# Patient Record
Sex: Female | Born: 1955 | Race: White | Hispanic: No | Marital: Married | Smoking: Never smoker
Health system: Southern US, Community
[De-identification: ages and names within clinical notes are randomized; demographics above are authoritative.]

## PROBLEM LIST (undated history)

## (undated) DIAGNOSIS — C50919 Malignant neoplasm of unspecified site of unspecified female breast: Secondary | ICD-10-CM

## (undated) DIAGNOSIS — M1990 Unspecified osteoarthritis, unspecified site: Secondary | ICD-10-CM

## (undated) DIAGNOSIS — J45909 Unspecified asthma, uncomplicated: Secondary | ICD-10-CM

## (undated) DIAGNOSIS — H269 Unspecified cataract: Secondary | ICD-10-CM

## (undated) DIAGNOSIS — I10 Essential (primary) hypertension: Secondary | ICD-10-CM

## (undated) DIAGNOSIS — Z8619 Personal history of other infectious and parasitic diseases: Secondary | ICD-10-CM

## (undated) DIAGNOSIS — Z8709 Personal history of other diseases of the respiratory system: Secondary | ICD-10-CM

## (undated) DIAGNOSIS — T7840XA Allergy, unspecified, initial encounter: Secondary | ICD-10-CM

## (undated) DIAGNOSIS — E0590 Thyrotoxicosis, unspecified without thyrotoxic crisis or storm: Secondary | ICD-10-CM

## (undated) HISTORY — PX: BREAST SURGERY: SHX581

## (undated) HISTORY — DX: Personal history of other diseases of the respiratory system: Z87.09

## (undated) HISTORY — DX: Malignant neoplasm of unspecified site of unspecified female breast: C50.919

## (undated) HISTORY — DX: Personal history of other infectious and parasitic diseases: Z86.19

## (undated) HISTORY — DX: Unspecified osteoarthritis, unspecified site: M19.90

## (undated) HISTORY — DX: Essential (primary) hypertension: I10

## (undated) HISTORY — DX: Allergy, unspecified, initial encounter: T78.40XA

## (undated) HISTORY — PX: COSMETIC SURGERY: SHX468

## (undated) HISTORY — PX: TUBAL LIGATION: SHX77

## (undated) HISTORY — DX: Unspecified asthma, uncomplicated: J45.909

## (undated) HISTORY — DX: Thyrotoxicosis, unspecified without thyrotoxic crisis or storm: E05.90

## (undated) HISTORY — DX: Unspecified cataract: H26.9

---

## 2002-10-01 HISTORY — PX: MASTECTOMY: SHX3

## 2014-01-18 DIAGNOSIS — J309 Allergic rhinitis, unspecified: Secondary | ICD-10-CM | POA: Insufficient documentation

## 2017-07-01 LAB — HM PAP SMEAR: HM Pap smear: UNDETERMINED

## 2018-01-01 LAB — HM HEPATITIS C SCREENING LAB: HM Hepatitis Screen: NEGATIVE

## 2018-07-31 LAB — HM MAMMOGRAPHY

## 2019-03-02 HISTORY — PX: CATARACT EXTRACTION, BILATERAL: SHX1313

## 2019-03-23 LAB — BASIC METABOLIC PANEL
BUN: 13 (ref 4–21)
Creatinine: 0.6 (ref 0.5–1.1)
Glucose: 97
Potassium: 4 (ref 3.4–5.3)
Sodium: 144 (ref 137–147)

## 2019-03-23 LAB — LIPID PANEL
Cholesterol: 224 — AB (ref 0–200)
HDL: 52 (ref 35–70)
LDL Cholesterol: 149
LDl/HDL Ratio: 4.3
Triglycerides: 115 (ref 40–160)

## 2019-06-01 LAB — HM COLONOSCOPY

## 2019-09-07 ENCOUNTER — Ambulatory Visit (INDEPENDENT_AMBULATORY_CARE_PROVIDER_SITE_OTHER): Payer: BC Managed Care – PPO | Admitting: Family Medicine

## 2019-09-07 ENCOUNTER — Encounter: Payer: Self-pay | Admitting: Family Medicine

## 2019-09-07 ENCOUNTER — Other Ambulatory Visit: Payer: Self-pay

## 2019-09-07 VITALS — BP 134/82 | HR 93 | Temp 98.6°F | Resp 12 | Ht 59.75 in | Wt 136.8 lb

## 2019-09-07 DIAGNOSIS — R87610 Atypical squamous cells of undetermined significance on cytologic smear of cervix (ASC-US): Secondary | ICD-10-CM | POA: Diagnosis not present

## 2019-09-07 DIAGNOSIS — Z9012 Acquired absence of left breast and nipple: Secondary | ICD-10-CM | POA: Diagnosis not present

## 2019-09-07 DIAGNOSIS — K5790 Diverticulosis of intestine, part unspecified, without perforation or abscess without bleeding: Secondary | ICD-10-CM | POA: Insufficient documentation

## 2019-09-07 DIAGNOSIS — Z853 Personal history of malignant neoplasm of breast: Secondary | ICD-10-CM | POA: Insufficient documentation

## 2019-09-07 NOTE — Patient Instructions (Signed)
Since your last PAP smear was negative for HPV testing (the virus that causes cervical cancer) the recommendation is to follow-up in 3 years.   Plan to schedule an annual exam in the next year and we will repeat a PAP at that visit.

## 2019-09-07 NOTE — Progress Notes (Signed)
   Subjective:     Kenedi Lattin is a 63 y.o. female presenting for Establish Care (previous PCP Dr Lilia Pro with [facility].)     HPI   #Abnormal PAP smear - has not had a PAP in 2 years - no new symptoms   #Hx of breast cancer  - needs repeat mammogram   Review of Systems  Constitutional: Negative for chills and fever.     Social History   Tobacco Use  Smoking Status Never Smoker  Smokeless Tobacco Never Used        Objective:    BP Readings from Last 3 Encounters:  09/07/19 134/82   Wt Readings from Last 3 Encounters:  09/07/19 136 lb 12 oz (62 kg)    BP 134/82   Pulse 93   Temp 98.6 F (37 C)   Resp 12   Ht 4' 11.75" (1.518 m)   Wt 136 lb 12 oz (62 kg)   SpO2 97%   BMI 26.93 kg/m    Physical Exam Constitutional:      General: She is not in acute distress.    Appearance: She is well-developed. She is not diaphoretic.  HENT:     Right Ear: External ear normal.     Left Ear: External ear normal.     Nose: Nose normal.  Eyes:     Conjunctiva/sclera: Conjunctivae normal.  Neck:     Musculoskeletal: Neck supple.  Cardiovascular:     Rate and Rhythm: Normal rate.  Pulmonary:     Effort: Pulmonary effort is normal.  Skin:    General: Skin is warm and dry.     Capillary Refill: Capillary refill takes less than 2 seconds.  Neurological:     Mental Status: She is alert. Mental status is at baseline.  Psychiatric:        Mood and Affect: Mood normal.        Behavior: Behavior normal.           Assessment & Plan:   Problem List Items Addressed This Visit      Genitourinary   ASCUS of cervix with negative high risk HPV    Noted in 2018. Recommended f/u is 3 years with HPV testing. Will repeat next year        Other   Hx of breast cancer - Primary    Due for annual mammogram. Ordered      Relevant Orders   MM DIGITAL SCREENING W/ IMPLANTS BILATERAL   Status post left mastectomy   Relevant Orders   MM DIGITAL SCREENING W/  IMPLANTS BILATERAL       Return for annual in 8-10 months.  Lesleigh Noe, MD

## 2019-09-07 NOTE — Assessment & Plan Note (Signed)
Due for annual mammogram. Ordered

## 2019-09-07 NOTE — Assessment & Plan Note (Signed)
Noted in 2018. Recommended f/u is 3 years with HPV testing. Will repeat next year

## 2019-11-11 ENCOUNTER — Ambulatory Visit
Admission: RE | Admit: 2019-11-11 | Discharge: 2019-11-11 | Disposition: A | Discharge status: Home / Self Care | Payer: 59 | Source: Ambulatory Visit | Attending: Family Medicine | Admitting: Family Medicine

## 2019-11-11 DIAGNOSIS — Z9012 Acquired absence of left breast and nipple: Secondary | ICD-10-CM | POA: Diagnosis not present

## 2019-11-11 DIAGNOSIS — Z1231 Encounter for screening mammogram for malignant neoplasm of breast: Secondary | ICD-10-CM | POA: Insufficient documentation

## 2019-11-11 DIAGNOSIS — Z853 Personal history of malignant neoplasm of breast: Secondary | ICD-10-CM | POA: Diagnosis not present

## 2019-11-12 ENCOUNTER — Encounter: Payer: Self-pay | Admitting: Family Medicine

## 2020-05-31 ENCOUNTER — Other Ambulatory Visit (HOSPITAL_COMMUNITY)
Admission: RE | Admit: 2020-05-31 | Discharge: 2020-05-31 | Disposition: A | Discharge status: Home / Self Care | Payer: 59 | Source: Ambulatory Visit | Attending: Family Medicine | Admitting: Family Medicine

## 2020-05-31 ENCOUNTER — Encounter: Payer: Self-pay | Admitting: Family Medicine

## 2020-05-31 ENCOUNTER — Other Ambulatory Visit: Payer: Self-pay

## 2020-05-31 ENCOUNTER — Ambulatory Visit (INDEPENDENT_AMBULATORY_CARE_PROVIDER_SITE_OTHER): Payer: 59 | Admitting: Family Medicine

## 2020-05-31 VITALS — BP 138/78 | HR 73 | Temp 98.4°F | Ht 60.0 in | Wt 137.5 lb

## 2020-05-31 DIAGNOSIS — Z131 Encounter for screening for diabetes mellitus: Secondary | ICD-10-CM

## 2020-05-31 DIAGNOSIS — Z8639 Personal history of other endocrine, nutritional and metabolic disease: Secondary | ICD-10-CM

## 2020-05-31 DIAGNOSIS — Z124 Encounter for screening for malignant neoplasm of cervix: Secondary | ICD-10-CM

## 2020-05-31 DIAGNOSIS — E782 Mixed hyperlipidemia: Secondary | ICD-10-CM | POA: Diagnosis not present

## 2020-05-31 DIAGNOSIS — Z0000 Encounter for general adult medical examination without abnormal findings: Secondary | ICD-10-CM | POA: Diagnosis not present

## 2020-05-31 LAB — LIPID PANEL
Cholesterol: 231 mg/dL — ABNORMAL HIGH (ref 0–200)
HDL: 55.4 mg/dL (ref >39.00)
LDL Cholesterol: 153 mg/dL — ABNORMAL HIGH (ref 0–99)
NonHDL: 175.57
Total CHOL/HDL Ratio: 4
Triglycerides: 113 mg/dL (ref 0.0–149.0)
VLDL: 22.6 mg/dL (ref 0.0–40.0)

## 2020-05-31 LAB — COMPREHENSIVE METABOLIC PANEL
ALT: 14 U/L (ref 0–35)
AST: 15 U/L (ref 0–37)
Albumin: 4.7 g/dL (ref 3.5–5.2)
Alkaline Phosphatase: 72 U/L (ref 39–117)
BUN: 14 mg/dL (ref 6–23)
CO2: 25 mEq/L (ref 19–32)
Calcium: 9.7 mg/dL (ref 8.4–10.5)
Chloride: 105 mEq/L (ref 96–112)
Creatinine, Ser: 0.63 mg/dL (ref 0.40–1.20)
GFR: 95.13 mL/min (ref >60.00)
Glucose, Bld: 85 mg/dL (ref 70–99)
Potassium: 3.9 mEq/L (ref 3.5–5.1)
Sodium: 140 mEq/L (ref 135–145)
Total Bilirubin: 0.4 mg/dL (ref 0.2–1.2)
Total Protein: 6.9 g/dL (ref 6.0–8.3)

## 2020-05-31 LAB — HEMOGLOBIN A1C: Hgb A1c MFr Bld: 5.8 % (ref 4.6–6.5)

## 2020-05-31 LAB — TSH: TSH: 1.66 u[IU]/mL (ref 0.35–4.50)

## 2020-05-31 NOTE — Progress Notes (Signed)
Annual Exam   Chief Complaint:  Chief Complaint  Patient presents with  . Annual Exam    History of Present Illness:  [name] is a 64 y.o. No obstetric history on file. who LMP was No LMP recorded. Patient is postmenopausal., presents today for her annual examination.     Nutrition She does not get adequate calcium and Vitamin D in her diet. Diet: tries to do low carb Exercise: walking around, not specific routine   Safety The patient wears seatbelts: yes.     The patient feels safe at home and in their relationships: yes.  Dentist: yes Eye doctor: yes  Menstrual:  Symptoms of menopause:no  GYN She is single partner, contraception - post menopausal status.    Cervical Cancer Screening (21-65):   Last Pap:   October 2018 Results were: no abnormalities / HPV not done History of abnormal PAPs in the past  Breast Cancer Screening (Age 59-74):  There is no FH of breast cancer. There is FH of ovarian cancer. BRCA screening Not Indicated.  Last Mammogram: 11/2019 The patient does want a mammogram this year.    Colon Cancer Screening:  Age 45-75 yo - benefits outweigh the risk. Adults 60-85 yo who have never been screened benefit.  Benefits: 134000 people in 2016 will be diagnosed and 49,000 will die - early detection helps [place]: Complications 2/2 to colonoscopy High Risk (Colonoscopy): genetic disorder (Lynch syndrome or familial adenomatous polyposis), personal hx of IBD, previous adenomatous polyp, or previous colorectal cancer, FamHx start 10 years before the age at diagnosis, increased in males and black race  Options:  FIT - looks for hemoglobin (blood in the stool) - specific and fairly sensitive - must be done annually Cologuard - looks for DNA and blood - more sensitive - therefore can have more false positives, every 3 years Colonoscopy - every 10 years if normal - sedation, bowl prep, must have someone drive you  Shared decision making and the patient  had decided to do - up to date on colonoscopy.   Social History   Tobacco Use  Smoking Status Never Smoker  Smokeless Tobacco Never Used   Weight Wt Readings from Last 3 Encounters:  05/31/20 137 lb 8 oz (62.4 kg)  09/07/19 136 lb 12 oz (62 kg)   Patient has high BMI  BMI Readings from Last 1 Encounters:  05/31/20 26.85 kg/m     Chronic disease screening Blood pressure monitoring:  BP Readings from Last 3 Encounters:  05/31/20 138/78  09/07/19 134/82    Lipid Monitoring: Indication for screening: age >38, obesity, diabetes, family hx, CV risk factors.  Lipid screening: Yes  Lab Results  Component Value Date   CHOL 224 (A) 03/23/2019   HDL 52 03/23/2019   LDLCALC 149 03/23/2019   TRIG 115 03/23/2019     Diabetes Screening: age >57, overweight, family hx, PCOS, hx of gestational diabetes, at risk ethnicity Diabetes Screening screening: Yes  No results found for: HGBA1C   Past Medical History:  Diagnosis Date  . Allergy   . Arthritis   . Asthma   . Breast cancer ([place])   . History of chicken pox   . History of chronic bronchitis   . Hyperthyroidism     Past Surgical History:  Procedure Laterality Date  . CATARACT EXTRACTION, BILATERAL  03/2019  . CESAREAN SECTION    . MASTECTOMY Left 2004    Prior to Admission medications   Medication Sig Start Date End Date  Taking? Authorizing Provider  diphenhydrAMINE (BENADRYL) 25 mg capsule Take 25 mg by mouth at bedtime.   Yes [provider]  Glucosamine HCl (GLUCOSAMINE PO) Take 1,500 mg by mouth in the morning and at bedtime.   Yes [provider]  Melatonin 5 MG CAPS Take 1 capsule by mouth at bedtime.   Yes [provider]    No Known Allergies  Gynecologic History: No LMP recorded. Patient is postmenopausal.  Obstetric History: No obstetric history on file.  Social History   Socioeconomic History  . Marital status: Married    Spouse name: Allison Jensen  . Number of children:  3  . Years of education: associates degree in nursing  . Highest education level: Not on file  Occupational History  . Not on file  Tobacco Use  . Smoking status: Never Smoker  . Smokeless tobacco: Never Used  Vaping Use  . Vaping Use: Never used  Substance and Sexual Activity  . Alcohol use: Yes    Comment: once a month, 1 glass  . Drug use: Never  . Sexual activity: Yes    Birth control/protection: Post-menopausal  Other Topics Concern  . 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 Difficulty of Paying Living Expenses: Not hard at all  Food Insecurity:   . Worried About Charity fundraiser in the Last Year: Not on file  . Ran Out of Food in the Last Year: Not on file  Transportation Needs:   . Lack of Transportation (Medical): Not on file  . Lack of Transportation (Non-Medical): Not on file  Physical Activity:   . Days of Exercise per Week: Not on file  . Minutes of Exercise per Session: Not on file  Stress:   . Feeling of Stress : Not on file  Social Connections:   . Frequency of Communication with Friends and Family: Not on file  . Frequency of Social Gatherings with Friends and Family: Not on file  . Attends Religious Services: Not on file  . Active Member of Clubs or Organizations: Not on file   . Attends Archivist Meetings: Not on file  . Marital Status: Not on file  Intimate Partner Violence:   . Fear of Current or Ex-Partner: Not on file  . Emotionally Abused: Not on file  . Physically Abused: Not on file  . Sexually Abused: Not on file    Family History  Problem Relation Age of Onset  . Arthritis Mother   . Hypertension Mother   . GER disease Mother   . COPD Father   . Hypertension Father   .  Clotting disorder Father        heparin induced clotting  . COPD Sister   . Ovarian cancer Sister   . Alcohol abuse Maternal Grandmother   . Arthritis Maternal Grandmother   . Diabetes Paternal Grandmother   . Hypertension Paternal Grandfather   . Breast cancer Neg Hx     Review of Systems  Constitutional: Negative for chills and fever.  HENT: Negative for congestion and sore throat.   Eyes: Negative for blurred vision and double vision.  Respiratory: Negative for shortness of breath.   Cardiovascular: Negative for chest pain.  Gastrointestinal: Negative for heartburn, nausea and vomiting.  Genitourinary: Negative.   Musculoskeletal: Negative.  Negative for myalgias.  Skin: Negative for rash.  Neurological: Negative for dizziness and headaches.  Endo/Heme/Allergies: Does not bruise/bleed easily.  Psychiatric/Behavioral: Negative for depression. The patient is not nervous/anxious.      Physical Exam BP 138/78   Pulse 73   Temp 98.4 F (36.9 C) (Temporal)   Ht 5' (1.524 m)   Wt 137 lb 8 oz (62.4 kg)   SpO2 96%   BMI 26.85 kg/m    BP Readings from Last 3 Encounters:  05/31/20 138/78  09/07/19 134/82      Physical Exam Exam conducted with a chaperone present.  Constitutional:      General: She is not in acute distress.    Appearance: She is well-developed. She is not diaphoretic.  HENT:     Head: Normocephalic and atraumatic.     Right Ear: Tympanic membrane and external ear normal.     Left Ear: Tympanic membrane and external ear normal.      Nose: Nose normal.  Eyes:     General: No scleral icterus.    Conjunctiva/sclera: Conjunctivae normal.  Cardiovascular:     Rate and Rhythm: Normal rate and regular rhythm.     Heart sounds: No murmur heard.   Pulmonary:     Effort: Pulmonary effort is normal. No respiratory distress.     Breath sounds: Normal breath sounds. No wheezing.  Abdominal:     General: Bowel sounds are normal. There is no distension.     Palpations: Abdomen is soft. There is no mass.     Tenderness: There is no abdominal tenderness. There is no guarding or rebound.  Genitourinary:    Vagina: Normal.     Cervix: Normal.  Musculoskeletal:        General: Normal range of motion.     Cervical back: Neck supple.  Lymphadenopathy:     Cervical: No cervical adenopathy.  Skin:    General: Skin is warm and dry.     Capillary Refill: Capillary refill takes less than 2 seconds.  Neurological:     Mental Status: She is alert and oriented to person, place, and time.     Deep Tendon Reflexes: Reflexes normal.  Psychiatric:        Mood and Affect: Mood normal.        Behavior: Behavior normal.     Results:  PHQ-9:   Depression screen [facility] 2/9 05/31/2020 09/07/2019  Decreased Interest 0 0  Down, Depressed, Hopeless 0 0  PHQ - 2 Score 0 0       Assessment: 64 y.o. No obstetric history on file. female here for routine annual physical examination.  Plan: Problem List Items Addressed This Visit    None      Screening: -- Blood pressure screen normal -- cholesterol screening: will obtain -- Weight  screening: overweight: continue to monitor -- Diabetes Screening: will obtain -- Nutrition: Encouraged healthy diet  The 10-year ASCVD risk score Mikey Bussing DC Brooke Bonito., et al., 2013) is: 6.3%*   Values used to calculate the score:     Age: 36 years     Sex: Female     Is Non-Hispanic African American: No     Diabetic: No     Tobacco smoker: No     Systolic Blood Pressure: 546 mmHg     Is BP treated: No      HDL Cholesterol: 52 mg/dL*     Total Cholesterol: 224 mg/dL*     * - Cholesterol units were assumed for this score calculation  -- Statin therapy for Age 2-75 with CVD risk >7.5%  Psych -- Depression screening (PHQ-9):   Depression screen [facility] 2/9 09/07/2019  Decreased Interest 0  Down, Depressed, Hopeless 0  PHQ - 2 Score 0      Safety -- tobacco screening: not using -- alcohol screening:  low-risk usage. -- no evidence of domestic violence or intimate partner violence.   Cancer Screening -- pap smear collected per ASCCP guidelines -- family history of breast cancer screening: done. not at high risk. -- Mammogram - up to date -- Colon cancer (age 1+)-- up to date  Immunizations Immunization History  Administered Date(s) Administered  . Pneumococcal Polysaccharide-23 01/01/2018    -- flu vaccine declined -- TDAP q10 years unknown, record requested -- Covid-19 Vaccine -- long discussion and patient declined vaccine   Encouraged healthy diet and exercise. Encouraged regular vision and dental care.    Lesleigh Noe, MD

## 2020-05-31 NOTE — Patient Instructions (Addendum)
  1) 800 units of Vitamin D daily 2) Get 1000-1200 mg of elemental calcium --- this is best from your diet. Try to track how much calcium you get on a typical day. You could find ways to add more (dairy products, leafy greens). Take a supplement for whatever you don't typically get so you reach 1200 mg of calcium.  3) Physical activity (ideally weight bearing) - like walking briskly 30 minutes 5 days a week.   Covid - continue to wear your mask and social distance - continue washing your hands regularly

## 2020-06-01 LAB — CYTOLOGY - PAP
Comment: NEGATIVE
Diagnosis: NEGATIVE
High risk HPV: NEGATIVE

## 2020-10-27 ENCOUNTER — Other Ambulatory Visit: Payer: Self-pay | Admitting: Family Medicine

## 2020-10-27 DIAGNOSIS — Z1231 Encounter for screening mammogram for malignant neoplasm of breast: Secondary | ICD-10-CM

## 2020-11-14 ENCOUNTER — Other Ambulatory Visit: Payer: Self-pay

## 2020-11-14 ENCOUNTER — Ambulatory Visit
Admission: RE | Admit: 2020-11-14 | Discharge: 2020-11-14 | Disposition: A | Discharge status: Home / Self Care | Payer: 59 | Source: Ambulatory Visit | Attending: Family Medicine | Admitting: Family Medicine

## 2020-11-14 DIAGNOSIS — Z1231 Encounter for screening mammogram for malignant neoplasm of breast: Secondary | ICD-10-CM | POA: Diagnosis present

## 2021-05-23 DIAGNOSIS — H524 Presbyopia: Secondary | ICD-10-CM | POA: Diagnosis not present

## 2021-11-07 ENCOUNTER — Other Ambulatory Visit: Payer: Self-pay | Admitting: Family Medicine

## 2021-11-07 DIAGNOSIS — Z1231 Encounter for screening mammogram for malignant neoplasm of breast: Secondary | ICD-10-CM

## 2021-12-21 ENCOUNTER — Other Ambulatory Visit: Payer: Self-pay | Admitting: Family Medicine

## 2021-12-21 ENCOUNTER — Ambulatory Visit
Admission: RE | Admit: 2021-12-21 | Discharge: 2021-12-21 | Disposition: A | Discharge status: Home / Self Care | Payer: No Typology Code available for payment source | Source: Ambulatory Visit | Attending: Family Medicine | Admitting: Family Medicine

## 2021-12-21 ENCOUNTER — Other Ambulatory Visit: Payer: Self-pay

## 2021-12-21 DIAGNOSIS — Z1231 Encounter for screening mammogram for malignant neoplasm of breast: Secondary | ICD-10-CM | POA: Insufficient documentation

## 2022-06-25 DIAGNOSIS — H524 Presbyopia: Secondary | ICD-10-CM | POA: Diagnosis not present

## 2022-06-29 ENCOUNTER — Ambulatory Visit (INDEPENDENT_AMBULATORY_CARE_PROVIDER_SITE_OTHER): Payer: No Typology Code available for payment source | Admitting: Family Medicine

## 2022-06-29 VITALS — BP 160/84 | HR 82 | Temp 97.8°F | Ht 60.0 in | Wt 141.4 lb

## 2022-06-29 DIAGNOSIS — E782 Mixed hyperlipidemia: Secondary | ICD-10-CM

## 2022-06-29 DIAGNOSIS — R7303 Prediabetes: Secondary | ICD-10-CM | POA: Insufficient documentation

## 2022-06-29 DIAGNOSIS — J4520 Mild intermittent asthma, uncomplicated: Secondary | ICD-10-CM | POA: Diagnosis not present

## 2022-06-29 DIAGNOSIS — R030 Elevated blood-pressure reading, without diagnosis of hypertension: Secondary | ICD-10-CM | POA: Diagnosis not present

## 2022-06-29 DIAGNOSIS — E2839 Other primary ovarian failure: Secondary | ICD-10-CM

## 2022-06-29 DIAGNOSIS — Z0000 Encounter for general adult medical examination without abnormal findings: Secondary | ICD-10-CM | POA: Diagnosis not present

## 2022-06-29 DIAGNOSIS — J45909 Unspecified asthma, uncomplicated: Secondary | ICD-10-CM | POA: Insufficient documentation

## 2022-06-29 LAB — CBC
HCT: 40 % (ref 36.0–46.0)
Hemoglobin: 13.3 g/dL (ref 12.0–15.0)
MCHC: 33.2 g/dL (ref 30.0–36.0)
MCV: 92 fl (ref 78.0–100.0)
Platelets: 280 10*3/uL (ref 150.0–400.0)
RBC: 4.35 Mil/uL (ref 3.87–5.11)
RDW: 14.3 % (ref 11.5–15.5)
WBC: 8.7 10*3/uL (ref 4.0–10.5)

## 2022-06-29 LAB — COMPREHENSIVE METABOLIC PANEL
ALT: 19 U/L (ref 0–35)
AST: 17 U/L (ref 0–37)
Albumin: 4.5 g/dL (ref 3.5–5.2)
Alkaline Phosphatase: 78 U/L (ref 39–117)
BUN: 12 mg/dL (ref 6–23)
CO2: 27 mEq/L (ref 19–32)
Calcium: 9.3 mg/dL (ref 8.4–10.5)
Chloride: 103 mEq/L (ref 96–112)
Creatinine, Ser: 0.68 mg/dL (ref 0.40–1.20)
GFR: 90.97 mL/min (ref >60.00)
Glucose, Bld: 84 mg/dL (ref 70–99)
Potassium: 4.1 mEq/L (ref 3.5–5.1)
Sodium: 140 mEq/L (ref 135–145)
Total Bilirubin: 0.5 mg/dL (ref 0.2–1.2)
Total Protein: 7.1 g/dL (ref 6.0–8.3)

## 2022-06-29 LAB — LIPID PANEL
Cholesterol: 226 mg/dL — ABNORMAL HIGH (ref 0–200)
HDL: 52.8 mg/dL (ref >39.00)
LDL Cholesterol: 147 mg/dL — ABNORMAL HIGH (ref 0–99)
NonHDL: 172.8
Total CHOL/HDL Ratio: 4
Triglycerides: 131 mg/dL (ref 0.0–149.0)
VLDL: 26.2 mg/dL (ref 0.0–40.0)

## 2022-06-29 LAB — TSH: TSH: 1.45 u[IU]/mL (ref 0.35–5.50)

## 2022-06-29 LAB — HEMOGLOBIN A1C: Hgb A1c MFr Bld: 6.1 % (ref 4.6–6.5)

## 2022-06-29 MED ORDER — ALBUTEROL SULFATE HFA 108 (90 BASE) MCG/ACT IN AERS: 2.0000 | INHALATION_SPRAY | Freq: Four times a day (QID) | RESPIRATORY_TRACT | 0 refills | Status: DC | PRN

## 2022-06-29 NOTE — Progress Notes (Signed)
Subjective:   Allison Jensen is a 66 y.o. female who presents for an Initial Medicare Annual Wellness Visit.  Review of Systems    Review of Systems  Constitutional:  Negative for chills and fever.  HENT:  Negative for congestion and sore throat.   Eyes:  Negative for blurred vision and double vision.  Respiratory:  Negative for shortness of breath.   Cardiovascular:  Negative for chest pain.  Gastrointestinal:  Negative for heartburn, nausea and vomiting.  Genitourinary: Negative.   Musculoskeletal: Negative.  Negative for myalgias.  Skin:  Negative for rash.  Neurological:  Positive for headaches. Negative for dizziness.  Endo/Heme/Allergies:  Does not bruise/bleed easily.  Psychiatric/Behavioral:  Negative for depression. The patient is not nervous/anxious.     Blood pressure 138-140/80s Also getting headaches   Cardiac Risk Factors include: advanced age (>27mn, >>68women);hypertension;sedentary lifestyle     Objective:    Today's Vitals   06/29/22 1122 06/29/22 1149  BP: (!) 158/90 (!) 160/84  Pulse: 82   Temp: 97.8 F (36.6 C)   TempSrc: Temporal   SpO2: 99%   Weight: 141 lb 6 oz (64.1 kg)   Height: 5' (1.524 m)    Body mass index is 27.61 kg/m.     06/29/2022   11:33 AM  Advanced Directives  Does Patient Have a Medical Advance Directive? No  Would patient like information on creating a medical advance directive? Yes (Allison Jensen - Information given)    Current Medications (verified) Outpatient Encounter Medications as of 06/29/2022  Medication Sig   albuterol (VENTOLIN HFA) 108 (90 Base) MCG/ACT inhaler Inhale 2 puffs into the lungs every 6 (six) hours as needed for wheezing or shortness of breath.   Calcium Carb-Cholecalciferol (CALCIUM 500 + D PO) Take 1 each by mouth 2 (two) times daily.   diphenhydrAMINE (BENADRYL) 25 mg capsule Take 25 mg by mouth at bedtime as needed for sleep.   Glucosamine HCl (GLUCOSAMINE PO) Take 1,500 mg  by mouth in the morning and at bedtime.   Melatonin 5 MG CAPS Take 1 capsule by mouth at bedtime.   TURMERIC PO Take by mouth daily at 12 noon.   No facility-administered encounter medications on file as of 06/29/2022.    Allergies (verified) Patient has no known allergies.   History: Past Medical History:  Diagnosis Date   Allergy    Arthritis    Asthma    [place]    History of chicken pox    History of chronic bronchitis    Hyperthyroidism    Past Surgical History:  Procedure Laterality Date   CATARACT EXTRACTION, BILATERAL  03/2019   CESAREAN SECTION     MASTECTOMY Left 2004   transflap   Family History  Problem Relation Age of Onset   Arthritis Mother    Hypertension Mother    GER disease Mother    COPD Father    Hypertension Father    Clotting disorder Father        heparin induced clotting   COPD Sister    Ovarian cancer Sister 552      unknown genetic testing   Alcohol abuse Maternal Grandmother    Arthritis Maternal Grandmother    Diabetes Paternal Grandmother    Hypertension Paternal Grandfather    Breast cancer Neg Hx    Social History   Socioeconomic History   Marital status: Married    Spouse name: Allison Jensen   Number of children: 3   Years of  education: associates degree in nursing   Highest education level: Not on file  Occupational History   Not on file  Tobacco Use   Smoking status: Never   Smokeless tobacco: Never  Vaping Use   Vaping Use: Never used  Substance and Sexual Activity   Alcohol use: Yes    Comment: once a month, 1 glass   Drug use: Never   Sexual activity: Yes    Birth control/protection: Post-menopausal  Other Topics Concern   Not on file  Social History Narrative   09/07/19   From: moved from [place] - moved to be near family   Living: with husband Allison Jensen   Work: 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 paid       Family: 3 children- Allison Jensen, and Allison Roch -- has 3 grandchildren (54 local 38 years old and 66 yo, and 66 yo in [place])      Enjoys: painting      Exercise: walking with daughter Allison Jensen   Diet: good overall, better than it was - has been Keto with some cheat days      Safety   Seat belts: Yes    Guns: Yes  and secure   Safe in relationships: Yes    Social Determinants of Health   Financial Resource Strain: Low Risk  (09/07/2019)   Overall Financial Resource Strain (CARDIA)    Difficulty of Paying Living Expenses: Not hard at all  Food Insecurity: Not on file  Transportation Needs: Not on file  Physical Activity: Not on file  Stress: Not on file  Social Connections: Not on file    Tobacco Counseling Counseling given: Not Answered   Clinical Intake:  Pre-visit preparation completed: No  Pain : No/denies pain     BMI - recorded: 27.61 Nutritional Status: BMI 25 -29 Overweight Nutritional Risks: None Diabetes: No  How often do you need to have someone help you when you read instructions, pamphlets, or other written materials from your doctor or pharmacy?: 1 - Never  Diabetic?no  Interpreter Needed?: No      Activities of Daily Living    06/29/2022   11:34 AM  In your present state of health, do you have any difficulty performing the following activities:  Hearing? 0  Vision? 0  Difficulty concentrating or making decisions? 0  Walking or climbing stairs? 0  Dressing or bathing? 0  Doing errands, shopping? 0  Preparing Food and eating ? N  Using the Toilet? N  In the past six months, have you accidently leaked urine? Y  Do you have problems with loss of bowel control? N  Managing your Medications? N  Managing your Finances? N  Housekeeping or managing your Housekeeping? N    Patient Care Team: Allison Noe, MD as PCP - General (Family Medicine)  Indicate any recent Medical Services you may have received from other than [facility]  providers in the past year (date may be approximate).     Assessment:   This is a routine wellness examination for Allison Jensen.  Hearing/Vision screen Hearing Screening   '250Hz'$  '500Hz'$  '1000Hz'$  '2000Hz'$  '4000Hz'$   Right ear '20 20 20 20 20  '$ Left ear '20 20 20 20 20  '$ Comments: No concerns  Vision Screening - Comments:: Gets yearly eye exams at [facility]  Dietary issues and exercise activities discussed: Current Exercise Habits: The patient does not participate in  regular exercise at present, Exercise limited by: None identified   Goals Addressed             This Visit's Progress    Patient Stated       Join silver sneakers      Depression Screen    06/29/2022   11:52 AM 05/31/2020   11:42 AM 09/07/2019   11:27 AM  PHQ 2/9 Scores  PHQ - 2 Score 0 0 0    Fall Risk    06/29/2022   11:21 AM  [place] in the past year? 0  Number falls in past yr: 0  Risk for fall due to : No Fall Risks    FALL RISK PREVENTION PERTAINING TO THE HOME:  Any stairs in or around the home? Yes  If so, are there any without handrails? Yes  Home free of loose throw rugs in walkways, pet beds, electrical cords, etc? Yes  Adequate lighting in your home to reduce risk of falls? Yes   ASSISTIVE DEVICES UTILIZED TO PREVENT FALLS:  Life alert? No  Use of a cane, walker or w/c? No  Grab bars in the bathroom? No  Shower chair or bench in shower? No  Elevated toilet seat or a handicapped toilet? No  Cognitive Function:      Mini-Cog - 06/29/22 1136     Normal clock drawing test? yes    How many words correct? 3                Immunizations Immunization History  Administered Date(s) Administered   Pneumococcal Polysaccharide-23 01/01/2018    TDAP status: Due, Education has been provided regarding the importance of this vaccine. Advised may receive this vaccine at local pharmacy or Health Dept. Aware to provide a copy of the vaccination record if obtained from local pharmacy or  Health Dept. Verbalized acceptance and understanding.  Flu Vaccine status: Declined, Education has been provided regarding the importance of this vaccine but patient still declined. Advised may receive this vaccine at local pharmacy or Health Dept. Aware to provide a copy of the vaccination record if obtained from local pharmacy or Health Dept. Verbalized acceptance and understanding.  Pneumococcal vaccine status: Declined,  Education has been provided regarding the importance of this vaccine but patient still declined. Advised may receive this vaccine at local pharmacy or Health Dept. Aware to provide a copy of the vaccination record if obtained from local pharmacy or Health Dept. Verbalized acceptance and understanding.   Covid-19 vaccine status: Declined, Education has been provided regarding the importance of this vaccine but patient still declined. Advised may receive this vaccine at local pharmacy or Health Dept.or vaccine clinic. Aware to provide a copy of the vaccination record if obtained from local pharmacy or Health Dept. Verbalized acceptance and understanding.  Qualifies for Shingles Vaccine? Yes   Zostavax completed No   Shingrix Completed?: No.    Education has been provided regarding the importance of this vaccine. Patient has been advised to call insurance company to determine out of pocket expense if they have not yet received this vaccine. Advised may also receive vaccine at local pharmacy or Health Dept. Verbalized acceptance and understanding.  Screening Tests Health Maintenance  Topic Date Due   TETANUS/TDAP  Never done   Zoster Vaccines- Shingrix (1 of 2) Never done   Pneumonia Vaccine 61+ Years old (2 - PCV) 05/28/2021   DEXA SCAN  Never done   MAMMOGRAM  12/22/2023   COLONOSCOPY (Pts  45-61yr Insurance coverage will need to be confirmed)  05/31/2029   Hepatitis C Screening  Completed   HPV VACCINES  Aged Out   INFLUENZA VACCINE  Discontinued   COVID-19 Vaccine   Discontinued    Health Maintenance  Health Maintenance Due  Topic Date Due   TETANUS/TDAP  Never done   Zoster Vaccines- Shingrix (1 of 2) Never done   Pneumonia Vaccine 66 Years old (2 - PCV) 05/28/2021   DEXA SCAN  Never done    Colorectal cancer screening: Type of screening: Colonoscopy. Completed 2020. Repeat every 10 years  Mammogram status: Completed 11/2021. Repeat every year  Bone Density status: Completed years ago. Results reflect: Bone density results: OSTEOPENIA. Repeat every 2-3 years.  Lung Cancer Screening: (Low Dose CT Chest recommended if Age 66-80years, 30 pack-year currently smoking OR have quit w/in 15years.) does not qualify.   Lung Cancer Screening Referral: n/a  Additional Screening:  Hepatitis C Screening: does qualify; Completed 2019  Vision Screening: Recommended annual ophthalmology exams for early detection of glaucoma and other disorders of the eye. Is the patient up to date with their annual eye exam?  Yes    Dental Screening: Recommended annual dental exams for proper oral hygiene  Community Resource Referral / Chronic Care Management: CRR required this visit?  No   CCM required this visit?  No      Plan:     Problem List Items Addressed This Visit       Respiratory   Reactive airway disease   Relevant Medications   albuterol (VENTOLIN HFA) 108 (90 Base) MCG/ACT inhaler     Other   Mixed hyperlipidemia   Relevant Orders   Lipid panel   Prediabetes   Relevant Orders   Hemoglobin A1c   Elevated blood pressure reading    Advised home monitoring, add regular exercise.  If persistently elevated return in 1 month.  Labs to assess for other risk factors or causes.      Relevant Orders   Comprehensive metabolic panel   Hemoglobin A1c   Lipid panel   TSH   CBC   Other Visit Diagnoses     Encounter for Medicare annual wellness exam    -  Primary   Estrogen deficiency       Relevant Orders   DG Bone Density        I  have personally reviewed and noted the following in the patient's chart:   Medical and social history Use of alcohol, tobacco or illicit drugs  Current medications and supplements including opioid prescriptions. Patient is not currently taking opioid prescriptions. Functional ability and status Nutritional status Physical activity Advanced directives List of other physicians Hospitalizations, surgeries, and ER visits in previous 12 months Vitals Screenings to include cognitive, depression, and falls Referrals and appointments  In addition, I have reviewed and discussed with patient certain preventive protocols, quality metrics, and best practice recommendations. A written personalized care plan for preventive services as well as general preventive health recommendations were provided to patient.     JLesleigh Noe MD   06/29/2022

## 2022-06-29 NOTE — Assessment & Plan Note (Signed)
Advised home monitoring, add regular exercise.  If persistently elevated return in 1 month.  Labs to assess for other risk factors or causes.

## 2022-06-29 NOTE — Patient Instructions (Signed)
Your blood pressure high.   High blood pressure increases your risk for heart attack and stroke.    Please check your blood pressure 2-4 times a week.   To check your blood pressure 1) Sit in a quiet and relaxed place for 5 minutes 2) Make sure your feet are flat on the ground 3) Consider checking first thing in the morning   Normal blood pressure is less than 140/90 Ideally you blood pressure should be around 120/80  Other ways you can reduce your blood pressure:  1) Regular exercise -- Try to get 150 minutes (30 minutes, 5 days a week) of moderate to vigorous aerobic excercise -- Examples: brisk walking (2.5 miles per hour), water aerobics, dancing, gardening, tennis, biking slower than 10 miles per hour 2) DASH Diet - low fat meats, more fresh fruits and vegetables, whole grains, low salt 3) Loose 5-10% of your body weight

## 2022-07-25 ENCOUNTER — Other Ambulatory Visit: Payer: Self-pay

## 2022-07-25 DIAGNOSIS — J4520 Mild intermittent asthma, uncomplicated: Secondary | ICD-10-CM

## 2022-07-25 MED ORDER — ALBUTEROL SULFATE HFA 108 (90 BASE) MCG/ACT IN AERS: 2.0000 | INHALATION_SPRAY | Freq: Four times a day (QID) | RESPIRATORY_TRACT | 0 refills | Status: DC | PRN

## 2022-07-30 DIAGNOSIS — E663 Overweight: Secondary | ICD-10-CM | POA: Diagnosis not present

## 2022-07-30 DIAGNOSIS — Z6827 Body mass index (BMI) 27.0-27.9, adult: Secondary | ICD-10-CM | POA: Diagnosis not present

## 2022-07-30 DIAGNOSIS — M19041 Primary osteoarthritis, right hand: Secondary | ICD-10-CM | POA: Diagnosis not present

## 2022-07-30 DIAGNOSIS — R7303 Prediabetes: Secondary | ICD-10-CM | POA: Diagnosis not present

## 2022-07-30 DIAGNOSIS — Z853 Personal history of malignant neoplasm of breast: Secondary | ICD-10-CM | POA: Diagnosis not present

## 2022-07-30 DIAGNOSIS — Z9013 Acquired absence of bilateral breasts and nipples: Secondary | ICD-10-CM | POA: Diagnosis not present

## 2022-07-30 DIAGNOSIS — Z008 Encounter for other general examination: Secondary | ICD-10-CM | POA: Diagnosis not present

## 2022-07-30 DIAGNOSIS — E785 Hyperlipidemia, unspecified: Secondary | ICD-10-CM | POA: Diagnosis not present

## 2022-07-30 DIAGNOSIS — M19042 Primary osteoarthritis, left hand: Secondary | ICD-10-CM | POA: Diagnosis not present

## 2022-07-30 DIAGNOSIS — I10 Essential (primary) hypertension: Secondary | ICD-10-CM | POA: Diagnosis not present

## 2022-07-30 DIAGNOSIS — J45909 Unspecified asthma, uncomplicated: Secondary | ICD-10-CM | POA: Diagnosis not present

## 2022-08-27 ENCOUNTER — Other Ambulatory Visit: Payer: Self-pay | Admitting: Family

## 2022-08-27 DIAGNOSIS — J4520 Mild intermittent asthma, uncomplicated: Secondary | ICD-10-CM

## 2022-09-28 ENCOUNTER — Encounter: Payer: No Typology Code available for payment source | Admitting: Nurse Practitioner

## 2022-10-02 DIAGNOSIS — Z961 Presence of intraocular lens: Secondary | ICD-10-CM | POA: Diagnosis not present

## 2022-10-02 DIAGNOSIS — H26492 Other secondary cataract, left eye: Secondary | ICD-10-CM | POA: Diagnosis not present

## 2022-10-02 DIAGNOSIS — H26493 Other secondary cataract, bilateral: Secondary | ICD-10-CM | POA: Diagnosis not present

## 2022-10-25 ENCOUNTER — Ambulatory Visit: Payer: No Typology Code available for payment source | Admitting: Internal Medicine

## 2022-10-25 ENCOUNTER — Encounter: Payer: Self-pay | Admitting: Internal Medicine

## 2022-10-25 VITALS — BP 138/88 | HR 74 | Temp 97.6°F | Ht 60.0 in | Wt 143.0 lb

## 2022-10-25 DIAGNOSIS — R030 Elevated blood-pressure reading, without diagnosis of hypertension: Secondary | ICD-10-CM | POA: Diagnosis not present

## 2022-10-25 NOTE — Progress Notes (Signed)
Subjective:    Patient ID: Allison Jensen, female    DOB: Jul 24, 1956, 67 y.o.   MRN: [id number]  HPI Here for transfer of care  Has had some intermittent elevated BP Has been keeping track of it in the past--but not recently Usually 130's/80's---only occasionally higher Highest 160/90 Gets headache with weather change---like when a low comes in Some stress headache  No regular exercise Eats fairly healthy  Current Outpatient Medications on File Prior to Visit  Medication Sig Dispense Refill   albuterol (VENTOLIN HFA) 108 (90 Base) MCG/ACT inhaler Inhale 2 puffs into the lungs every 6 (six) hours as needed for wheezing or shortness of breath. 18 g 0   Calcium Carb-Cholecalciferol (CALCIUM 500 + D PO) Take 1 each by mouth 2 (two) times daily.     diphenhydrAMINE (BENADRYL) 25 mg capsule Take 25 mg by mouth at bedtime as needed for sleep.     Glucosamine HCl (GLUCOSAMINE PO) Take 1,500 mg by mouth in the morning and at bedtime.     Melatonin 5 MG CAPS Take 1 capsule by mouth at bedtime.     TURMERIC PO Take by mouth daily at 12 noon.     No current facility-administered medications on file prior to visit.    No Known Allergies  Past Medical History:  Diagnosis Date   Allergy    Arthritis    Asthma    Breast cancer ([place])    History of chicken pox    History of chronic bronchitis    Hyperthyroidism     Past Surgical History:  Procedure Laterality Date   CATARACT EXTRACTION, BILATERAL  03/2019   CESAREAN SECTION     MASTECTOMY Left 2004   transflap    Family History  Problem Relation Age of Onset   Arthritis Mother    Hypertension Mother    GER disease Mother    COPD Father    Hypertension Father    Clotting disorder Father        heparin induced clotting   COPD Sister    Ovarian cancer Sister 73       unknown genetic testing   Alcohol abuse Maternal Grandmother    Arthritis Maternal Grandmother    Diabetes Paternal Grandmother    Hypertension Paternal  Grandfather    Breast cancer Neg Hx     Social History   Socioeconomic History   Marital status: Married    Spouse name: Connell Bognar   Number of children: 3   Years of education: associates degree in nursing   Highest education level: Not on file  Occupational History   Occupation: Tourist information centre manager    Comment: [employer] in [place]  Tobacco Use   Smoking status: Never    Passive exposure: Current   Smokeless tobacco: Never  Vaping Use   Vaping Use: Never used  Substance and Sexual Activity   Alcohol use: Yes    Comment: once a month, 1 glass   Drug use: Never   Sexual activity: Yes    Birth control/protection: Post-menopausal  Other Topics Concern   Not on file  Social History Narrative   09/07/19   From: moved from [place] - moved to be near family   Living: with husband [name]   Work: 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 Volunteer for [employer]-- and will now be paid      Family: 3 children- [name], and [name] -- has 3 grandchildren (2 local  1 years old and 87 yo, and 67 yo in [place])      Enjoys: painting      Exercise: walking with daughter [name] three days a week   Diet: good overall, better than it was - has been Keto with some cheat days      Safety   Seat belts: Yes    Guns: Yes  and secure   Safe in relationships: Yes    Social Determinants of Health   Financial Resource Strain: Low Risk  (09/07/2019)   Overall Financial Resource Strain (CARDIA)    Difficulty of Paying Living Expenses: Not hard at all  Food Insecurity: Not on file  Transportation Needs: Not on file  Physical Activity: Not on file  Stress: Not on file  Social Connections: Not on file  Intimate Partner Violence: Not on file   Review of Systems Sleeps okay Weight is stable--maybe up slightly this winter    Objective:   Physical Exam Constitutional:      Appearance: Normal appearance.  Cardiovascular:     Rate and Rhythm:  Normal rate and regular rhythm.     Heart sounds: No murmur heard.    No gallop.  Pulmonary:     Effort: Pulmonary effort is normal.     Breath sounds: Normal breath sounds. No wheezing or rales.  Musculoskeletal:     Cervical back: Neck supple.     Right lower leg: No edema.     Left lower leg: No edema.  Lymphadenopathy:     Cervical: No cervical adenopathy.  Neurological:     Mental Status: She is alert.  Psychiatric:        Mood and Affect: Mood normal.        Behavior: Behavior normal.            Assessment & Plan:

## 2022-10-25 NOTE — Assessment & Plan Note (Signed)
BP Readings from Last 3 Encounters:  10/25/22 138/88  06/29/22 (!) 160/84  05/31/20 138/78   Mostly running okay Not clear that medication is appropriate as yet Discussed starting regular exercise DASH eating---and really needs to cut out salt

## 2022-10-25 NOTE — Patient Instructions (Signed)

## 2022-11-25 IMAGING — MG DIGITAL SCREENING UNILAT RIGHT IMPLANT  W/ TOMO W/ CAD
6 series · 6 of 14 positions shown · non-contrast
Comparison: Previous exam(s).

CLINICAL DATA: Screening.

EXAM:
DIGITAL SCREENING UNILATERAL RIGHT MAMMOGRAM WITH IMPLANTS, CAD AND
TOMOSYNTHESIS
TECHNIQUE: Right screening digital craniocaudal and mediolateral oblique
mammograms were obtained. Right screening digital breast
tomosynthesis was performed. The images were evaluated with
computer-aided detection. Standard and/or implant displaced views
were performed.

[R CC]
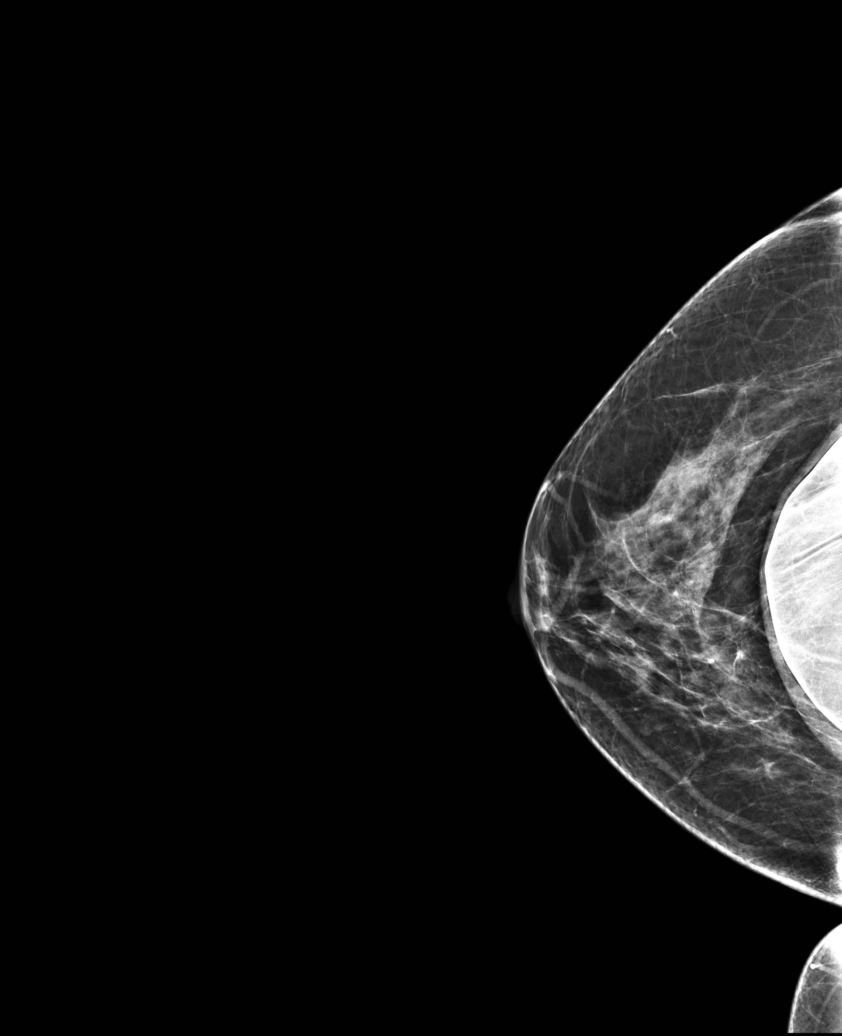

[R MLO]
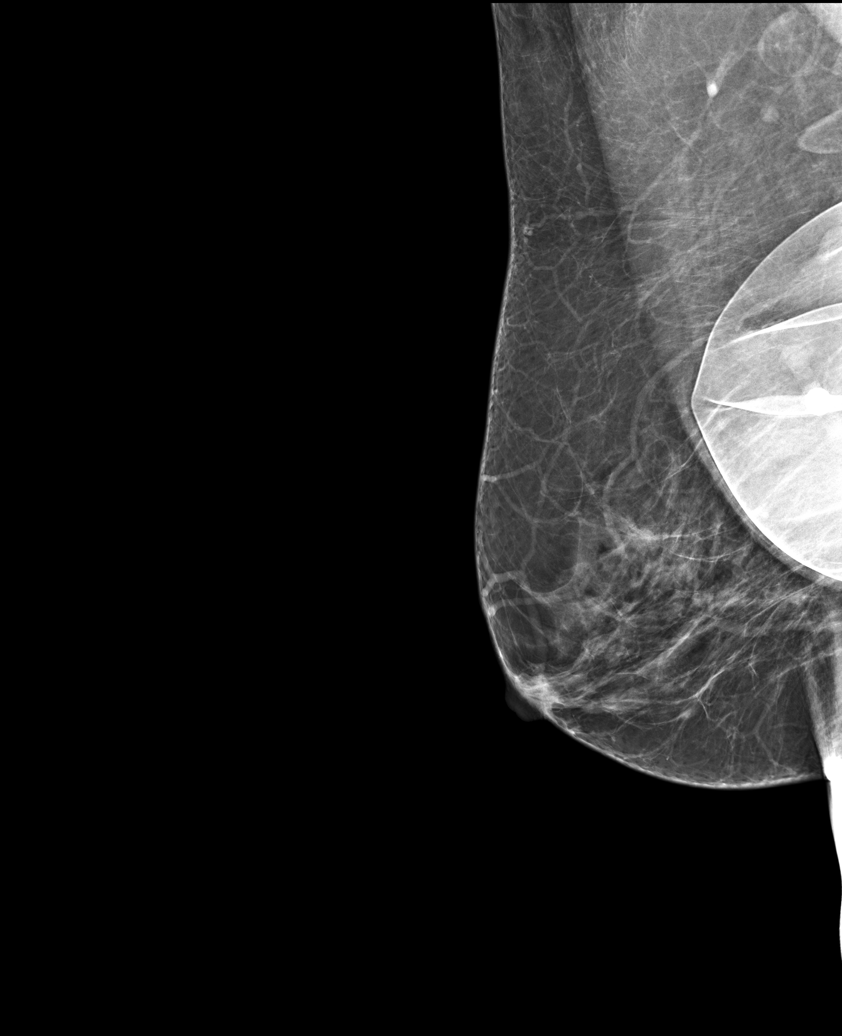

[R CC synth-2D]
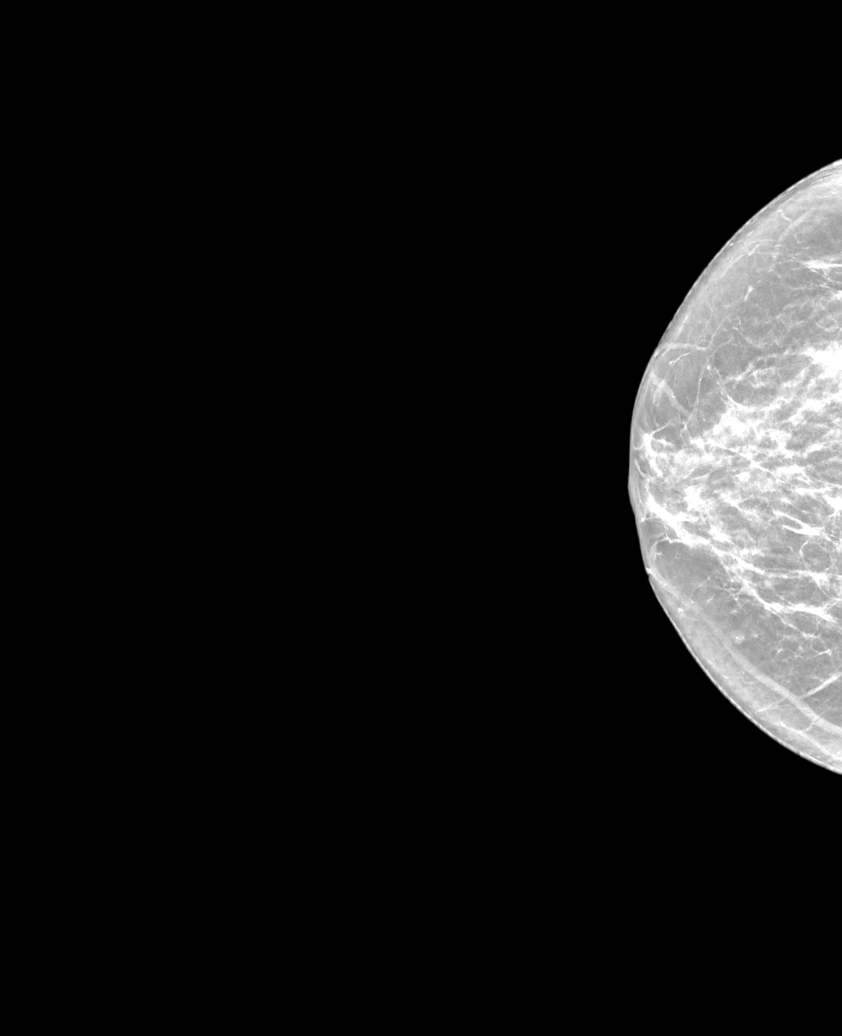

[R MLO synth-2D]
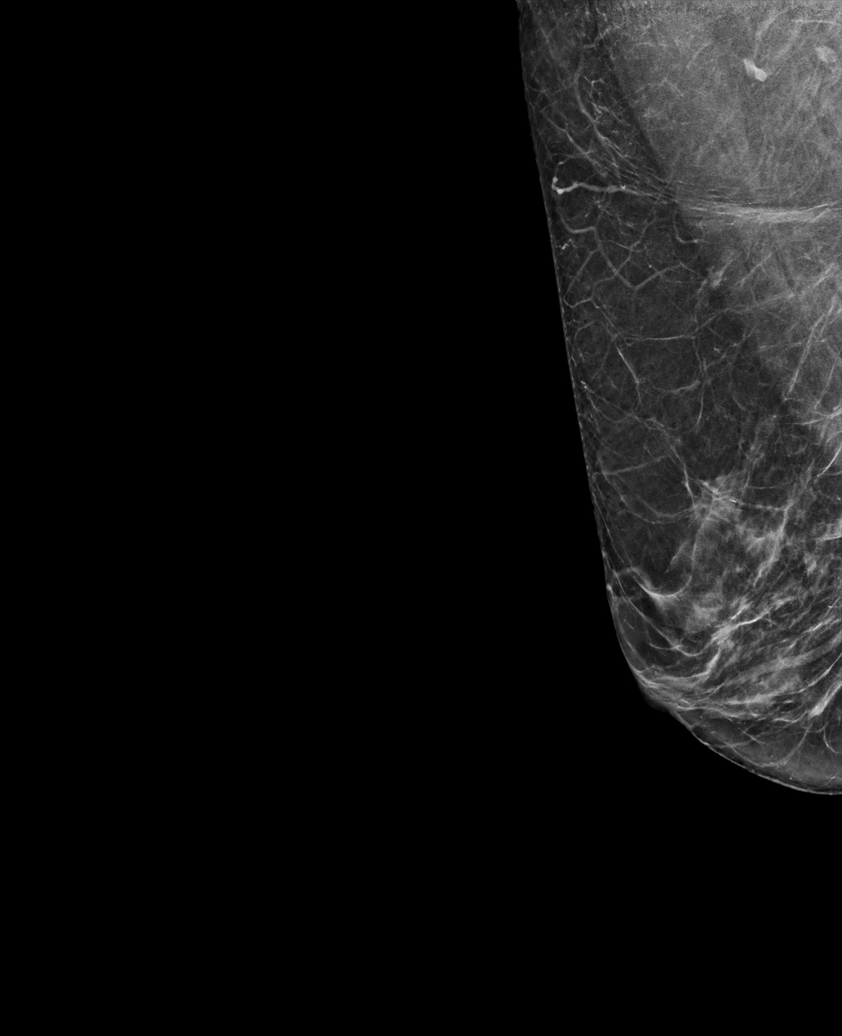

[R CC tomo · tomo slice 27/53.0]
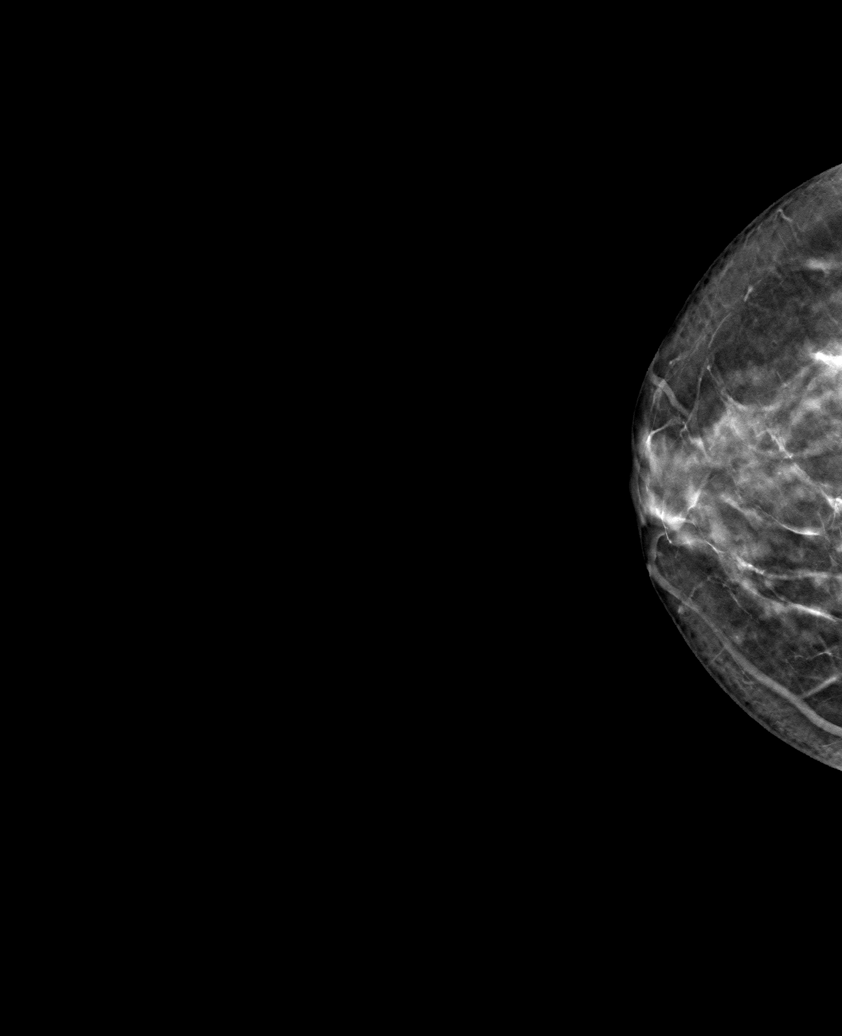

[R MLO tomo · tomo slice 31/60.0]
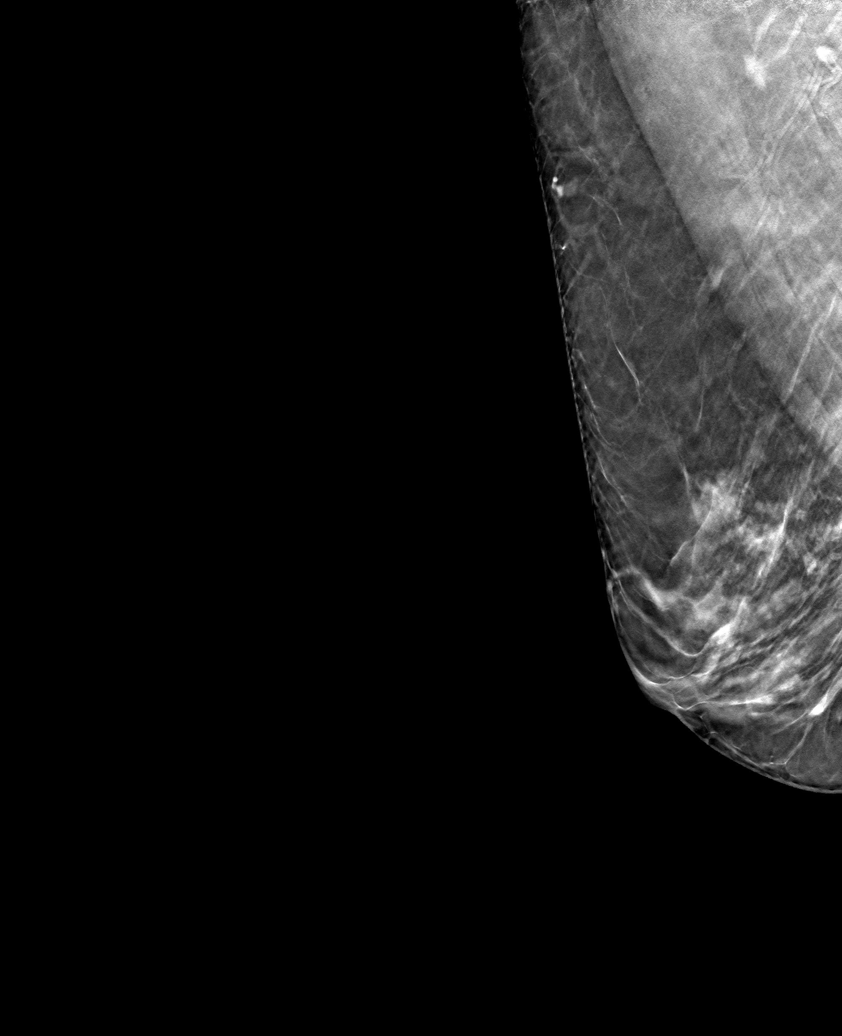

[6 of 14 positions shown; findings below may reference images not displayed]

ACR Breast Density Category c: The breast tissue is heterogeneously
dense, which may obscure small masses.
FINDINGS: The patient has a retropectoral implant. The patient has had a left
mastectomy. There are no findings suspicious for malignancy.
IMPRESSION: No mammographic evidence of malignancy. A result letter of this
screening mammogram will be mailed directly to the patient.

RECOMMENDATION:
Screening mammogram in one year.  (Code:MG-M-1VG)

BI-RADS CATEGORY  1.  Negative

## 2023-01-17 ENCOUNTER — Other Ambulatory Visit: Payer: Self-pay | Admitting: Internal Medicine

## 2023-01-17 DIAGNOSIS — Z1231 Encounter for screening mammogram for malignant neoplasm of breast: Secondary | ICD-10-CM

## 2023-02-07 ENCOUNTER — Ambulatory Visit
Admission: RE | Admit: 2023-02-07 | Discharge: 2023-02-07 | Disposition: A | Discharge status: Home / Self Care | Payer: No Typology Code available for payment source | Source: Ambulatory Visit | Attending: Internal Medicine | Admitting: Internal Medicine

## 2023-02-07 DIAGNOSIS — Z1231 Encounter for screening mammogram for malignant neoplasm of breast: Secondary | ICD-10-CM | POA: Insufficient documentation

## 2023-02-15 DIAGNOSIS — Z0100 Encounter for examination of eyes and vision without abnormal findings: Secondary | ICD-10-CM | POA: Diagnosis not present

## 2023-03-18 ENCOUNTER — Ambulatory Visit (INDEPENDENT_AMBULATORY_CARE_PROVIDER_SITE_OTHER): Payer: No Typology Code available for payment source | Admitting: Nurse Practitioner

## 2023-03-18 ENCOUNTER — Encounter: Payer: Self-pay | Admitting: Nurse Practitioner

## 2023-03-18 VITALS — BP 132/72 | HR 88 | Temp 98.4°F | Resp 16 | Ht 60.0 in | Wt 143.4 lb

## 2023-03-18 DIAGNOSIS — I10 Essential (primary) hypertension: Secondary | ICD-10-CM | POA: Diagnosis not present

## 2023-03-18 DIAGNOSIS — R200 Anesthesia of skin: Secondary | ICD-10-CM | POA: Insufficient documentation

## 2023-03-18 MED ORDER — AMLODIPINE BESYLATE 2.5 MG PO TABS: 2.5000 mg | ORAL_TABLET | Freq: Every day | ORAL | 0 refills | Status: DC

## 2023-03-18 NOTE — Progress Notes (Signed)
Acute Office Visit  Subjective:     Patient ID: Lakai Trevillion, female    DOB: 26-May-1956, 67 y.o.   MRN: [id number]  Chief Complaint  Patient presents with   Hypertension    For a while     Hypertension Associated symptoms include headaches. Pertinent negatives include no chest pain or shortness of breath.   Patient is in today for blood pressure concern. 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 Last office visit was 10/25/2022 with concern for elevated bp. She was given a DASH eating plan   States that she had a headache a couple weeks ago. States that it did last 2 weeks. States that she does have a history of headaches with weather She ran into a friend and could not remember her friend and started checking her bp  160/90 169/91, 157/90.  Not routiene exerxise. States 3 meals a day. States sometimes breakfast small and sometimes later. States that she does cook with salt but does not add it.   Numbness: states in bilateral feet for a couple months. Intermittent. States that sworse after she is sitting with the legelevated. Or when she is in the bed laying down  Review of Systems  Constitutional:  Negative for chills and fever.  Respiratory:  Negative for shortness of breath.   Cardiovascular:  Negative for chest pain and leg swelling.  Neurological:  Positive for tingling (numbness) and headaches. Negative for dizziness.  Psychiatric/Behavioral:  Negative for hallucinations and suicidal ideas.         Objective:    BP 132/72   Pulse 88   Temp 98.4 F (36.9 C)   Resp 16   Ht 5' (1.524 m)   Wt 143 lb 6 oz (65 kg)   SpO2 96%   BMI 28.00 kg/m  BP Readings from Last 3 Encounters:  03/18/23 132/72  10/25/22 138/88  06/29/22 (!) 160/84   Wt Readings from Last 3 Encounters:  03/18/23 143 lb 6 oz (65 kg)  10/25/22 143 lb (64.9 kg)  06/29/22 141 lb 6 oz (64.1 kg)      Physical Exam Vitals and nursing note reviewed.   Constitutional:      Appearance: Normal appearance.  HENT:     Mouth/Throat:     Mouth: Mucous membranes are moist.  Eyes:     Extraocular Movements: Extraocular movements intact.     Pupils: Pupils are equal, round, and reactive to light.  Cardiovascular:     Rate and Rhythm: Normal rate and regular rhythm.     Heart sounds: Normal heart sounds.  Pulmonary:     Effort: Pulmonary effort is normal.     Breath sounds: Normal breath sounds.  Musculoskeletal:     Right lower leg: No edema.     Left lower leg: No edema.  Lymphadenopathy:     Cervical: No cervical adenopathy.  Neurological:     General: No focal deficit present.     Mental Status: She is alert.     Deep Tendon Reflexes:     Reflex Scores:      Bicep reflexes are 2+ on the right side and 2+ on the left side.      Patellar reflexes are 2+ on the right side and 2+ on the left side.    Comments: Bilateral upper and lower extremity strength 5/5     No results found for any visits on 03/18/23.      Assessment & Plan:  Problem List Items Addressed This Visit       Cardiovascular and Mediastinum   Primary hypertension - Primary    Patient had several elevated readings here in office patient has checked her blood pressure at home and has had many elevated readings more elevated than normal readings.  Will like to treat with amlodipine 2.5 mg daily.  Follow-up 1 month for blood pressure recheck sooner if needed      Relevant Medications   amLODipine (NORVASC) 2.5 MG tablet     Other   Numbness    Bilateral feet globally.  Only when her feet are elevated likely positional.  She can start taking a B12 supplement which she used to do in the past and on follow-up we can revisit see if is improved.  Neurovascularly intact office       Meds ordered this encounter  Medications   amLODipine (NORVASC) 2.5 MG tablet    Sig: Take 1 tablet (2.5 mg total) by mouth daily.    Dispense:  90 tablet    Refill:  0     Order Specific Question:   Supervising Provider    Answer:   Roxy Manns A [[id number]]    Return in about 4 weeks (around 04/15/2023) for BP recheck.  Audria Nine, NP

## 2023-03-18 NOTE — Assessment & Plan Note (Signed)
Bilateral feet globally.  Only when her feet are elevated likely positional.  She can start taking a B12 supplement which she used to do in the past and on follow-up we can revisit see if is improved.  Neurovascularly intact office

## 2023-03-18 NOTE — Patient Instructions (Signed)
Nice to see you today Start taking a B12 supplement over the counter daily should be plenty Follow up in one month for a BP recheck, sooner if you need me  Check blood pressure 1-2 times a week and record it for me

## 2023-03-18 NOTE — Assessment & Plan Note (Signed)
Patient had several elevated readings here in office patient has checked her blood pressure at home and has had many elevated readings more elevated than normal readings.  Will like to treat with amlodipine 2.5 mg daily.  Follow-up 1 month for blood pressure recheck sooner if needed

## 2023-04-18 ENCOUNTER — Ambulatory Visit: Payer: No Typology Code available for payment source | Admitting: Nurse Practitioner

## 2023-04-18 ENCOUNTER — Telehealth: Payer: Self-pay | Admitting: Internal Medicine

## 2023-04-18 ENCOUNTER — Encounter: Payer: Self-pay | Admitting: Nurse Practitioner

## 2023-04-18 VITALS — BP 126/72 | HR 97 | Temp 98.1°F | Ht 60.0 in | Wt 143.8 lb

## 2023-04-18 DIAGNOSIS — I10 Essential (primary) hypertension: Secondary | ICD-10-CM

## 2023-04-18 MED ORDER — AMLODIPINE BESYLATE 5 MG PO TABS: 5.0000 mg | ORAL_TABLET | Freq: Every day | ORAL | 1 refills | Status: DC

## 2023-04-18 NOTE — Telephone Encounter (Signed)
Ok with me also 

## 2023-04-18 NOTE — Progress Notes (Signed)
   Established Patient Office Visit  Subjective   Patient ID: Allison Jensen, female    DOB: 01/19/1956  Age: 67 y.o. MRN: [id number]  Chief Complaint  Patient presents with   Hypertension    BP recheck. Pt checked last Saturday 130/80.       HTN: Patient was seen by me on 03/18/2023 with a complaint of elevated blood pressures at home and in office.  Patient was started on amlodipine 2.5 mg daily and is here for follow-up.  Was a patient of Dr. Selena Batten in the past Dr. Alphonsus Sias. States that she is checking her blood pressure a couple times a week at home  States that she is tolerating the medication well. Denies constipation or new swelling     Review of Systems  Constitutional:  Negative for chills and fever.  Respiratory:  Negative for shortness of breath.   Cardiovascular:  Negative for chest pain.  Neurological:  Negative for dizziness and headaches.      Objective:     BP 126/72   Pulse 97   Temp 98.1 F (36.7 C) (Temporal)   Ht 5' (1.524 m)   Wt 143 lb 12.8 oz (65.2 kg)   SpO2 96%   BMI 28.08 kg/m  BP Readings from Last 3 Encounters:  04/18/23 126/72  03/18/23 132/72  10/25/22 138/88   Wt Readings from Last 3 Encounters:  04/18/23 143 lb 12.8 oz (65.2 kg)  03/18/23 143 lb 6 oz (65 kg)  10/25/22 143 lb (64.9 kg)      Physical Exam Vitals and nursing note reviewed.  Constitutional:      Appearance: Normal appearance.  Cardiovascular:     Rate and Rhythm: Normal rate and regular rhythm.     Heart sounds: Normal heart sounds.  Pulmonary:     Effort: Pulmonary effort is normal.     Breath sounds: Normal breath sounds.  Musculoskeletal:     Right lower leg: No edema.     Left lower leg: No edema.  Neurological:     Mental Status: She is alert.      No results found for any visits on 04/18/23.    The 10-year ASCVD risk score (Arnett DK, et al., 2019) is: 8.6%    Assessment & Plan:   Problem List Items Addressed This Visit       Cardiovascular  and Mediastinum   Primary hypertension - Primary    Patient currently maintained on amlodipine 2.5 mg daily.  She is checking her blood pressure at home.  Tolerating medication well.  Patient blood pressure still outside of goal with readings up to 160s systolic. Patient's blood pressure was elevated at beginning of office visit but has come down since.  Did give precautions she gets lightheaded or dizzy we will cut back to 2.5 mg of amlodipine.  Follow-up 3 months      Relevant Medications   amLODipine (NORVASC) 5 MG tablet    Return in about 3 months (around 07/19/2023) for CPE and Labs/ TOC.    Audria Nine, NP

## 2023-04-18 NOTE — Assessment & Plan Note (Signed)
Patient currently maintained on amlodipine 2.5 mg daily.  She is checking her blood pressure at home.  Tolerating medication well.  Patient blood pressure still outside of goal with readings up to 160s systolic. Patient's blood pressure was elevated at beginning of office visit but has come down since.  Did give precautions she gets lightheaded or dizzy we will cut back to 2.5 mg of amlodipine.  Follow-up 3 months

## 2023-04-18 NOTE — Patient Instructions (Signed)
Nice to see you today I have increased the amlodipine to 5mg . You can take 2 of the 2.5mg  tabs until finished See you in 3 months for you physical and labs

## 2023-04-18 NOTE — Telephone Encounter (Signed)
Patient would like to transfer care from Dr. Alphonsus Sias to [facility] since he is retiring next fall. Okay to set patient up for TOC?

## 2023-07-04 ENCOUNTER — Encounter: Payer: Self-pay | Admitting: Nurse Practitioner

## 2023-07-04 ENCOUNTER — Ambulatory Visit (INDEPENDENT_AMBULATORY_CARE_PROVIDER_SITE_OTHER): Payer: No Typology Code available for payment source | Admitting: Nurse Practitioner

## 2023-07-04 ENCOUNTER — Encounter: Payer: No Typology Code available for payment source | Admitting: Nurse Practitioner

## 2023-07-04 VITALS — BP 142/90 | HR 84 | Temp 98.0°F | Ht 60.0 in | Wt 141.6 lb

## 2023-07-04 DIAGNOSIS — I10 Essential (primary) hypertension: Secondary | ICD-10-CM

## 2023-07-04 DIAGNOSIS — J4520 Mild intermittent asthma, uncomplicated: Secondary | ICD-10-CM

## 2023-07-04 DIAGNOSIS — Z0000 Encounter for general adult medical examination without abnormal findings: Secondary | ICD-10-CM | POA: Insufficient documentation

## 2023-07-04 DIAGNOSIS — Z13820 Encounter for screening for osteoporosis: Secondary | ICD-10-CM | POA: Diagnosis not present

## 2023-07-04 DIAGNOSIS — Z9012 Acquired absence of left breast and nipple: Secondary | ICD-10-CM

## 2023-07-04 DIAGNOSIS — Z853 Personal history of malignant neoplasm of breast: Secondary | ICD-10-CM | POA: Diagnosis not present

## 2023-07-04 DIAGNOSIS — E782 Mixed hyperlipidemia: Secondary | ICD-10-CM | POA: Diagnosis not present

## 2023-07-04 DIAGNOSIS — R7303 Prediabetes: Secondary | ICD-10-CM

## 2023-07-04 NOTE — Assessment & Plan Note (Signed)
Discussed age-appropriate immunizations and screening exams.  Did review patient's personal, surgical, social, family history.  Patient up-to-date on all age-appropriate vaccinations she would like.  She declined the flu vaccine and the shingles vaccine today.  Patient up-to-date on CRC screening.  Patient is aged out of cervical cancer screening.  Patient is up-to-date on breast cancer screening.  Patient was given information at discharge about preventative healthcare maintenance with anticipatory guidance.

## 2023-07-04 NOTE — Progress Notes (Signed)
Established Patient Office Visit  Subjective   Patient ID: Allison Jensen, female    DOB: 1956/03/02  Age: 67 y.o. MRN: [id number]  Chief Complaint  Patient presents with   Transitions Of Care   Follow-up    Pt BP has been 146/74. Pt states it has been in 140s since July.    Annual Exam    HPI  TOC: was being seen by Tillman Abide. Last seen by me   HTN: states that she has been checking it at home. States that she went back on it. States that she is getting 140s-80 off the medication  146/74 today  Asthma: states that she has used it a few times in the past few weeks with allergies. Or her sones with the dog   for complete physical and follow up of chronic conditions.  Immunizations: -Tetanus: Completed in withing 10 year -Influenza:  refused  -Shingles: refused  -Pneumonia: Completed  -covid: refused   Diet: Fair diet. 3 meals a day with some healthy snacks. States that with coffee and unsweet.  Exercise: No regular exercise. States that she does yard work and works at the [employer] exam: Completes annually. Glasses   Dental exam: Completes semi-annually    Colonoscopy: Completed in 06/01/2019, repeat 2030 Lung Cancer Screening: NA   Pap smear: states that she is aged out and has had one abnormal pap in the past last 1 done in 2021 was normal with negative HPV  Mammogram: 02/07/2023  Dexa: order today  Sleep: states she goes to sleep 930-10 ad get up at 6. Feels rested most time. Does not snore        Review of Systems  Constitutional:  Negative for chills and fever.  Respiratory:  Negative for shortness of breath.   Cardiovascular:  Negative for chest pain and leg swelling.  Gastrointestinal:  Negative for abdominal pain, blood in stool, constipation, diarrhea, nausea and vomiting.       BM daily   Genitourinary:  Negative for dysuria and hematuria.  Neurological:  Negative for tingling (feet intermittenlty) and headaches.   Psychiatric/Behavioral:  Negative for hallucinations and suicidal ideas.       Objective:     BP (!) 142/90   Pulse 84   Temp 98 F (36.7 C) (Oral)   Ht 5' (1.524 m)   Wt 141 lb 9.6 oz (64.2 kg)   SpO2 97%   BMI 27.65 kg/m    Physical Exam Vitals and nursing note reviewed.  Constitutional:      Appearance: Normal appearance.  HENT:     Right Ear: Tympanic membrane, ear canal and external ear normal.     Left Ear: Tympanic membrane, ear canal and external ear normal.     Mouth/Throat:     Mouth: Mucous membranes are moist.     Pharynx: Oropharynx is clear.  Eyes:     Extraocular Movements: Extraocular movements intact.     Pupils: Pupils are equal, round, and reactive to light.  Cardiovascular:     Rate and Rhythm: Normal rate and regular rhythm.     Pulses: Normal pulses.     Heart sounds: Normal heart sounds.  Pulmonary:     Effort: Pulmonary effort is normal.     Breath sounds: Normal breath sounds.  Abdominal:     General: Bowel sounds are normal. There is no distension.     Palpations: There is no mass.     Tenderness: There is no abdominal  tenderness.     Hernia: No hernia is present.  Musculoskeletal:     Right lower leg: No edema.     Left lower leg: No edema.  Lymphadenopathy:     Cervical: No cervical adenopathy.  Skin:    General: Skin is warm.  Neurological:     General: No focal deficit present.     Mental Status: She is alert.     Deep Tendon Reflexes:     Reflex Scores:      Bicep reflexes are 2+ on the right side and 2+ on the left side.      Patellar reflexes are 2+ on the right side and 2+ on the left side.    Comments: Bilateral upper and lower extremity strength 5/5  Psychiatric:        Mood and Affect: Mood normal.        Behavior: Behavior normal.        Thought Content: Thought content normal.        Judgment: Judgment normal.      No results found for any visits on 07/04/23.    The 10-year ASCVD risk score (Arnett DK, et  al., 2019) is: 12%    Assessment & Plan:   Problem List Items Addressed This Visit       Cardiovascular and Mediastinum   Primary hypertension    Patient currently maintained on amlodipine 5 mg daily.  She did take time off the medication and has been back on it for about 1 week.  Will not change medication today she will check her blood pressure at home parameters given at discharge      Relevant Orders   CBC   Comprehensive metabolic panel     Respiratory   Reactive airway disease    Infrequent albuterol use.  Continue albuterol inhaler as needed        Other   Hx of breast cancer    Status postmastectomy.  Mammogram up-to-date      Status post left mastectomy   Mixed hyperlipidemia    History of the same pending lipid panel      Relevant Orders   Lipid panel   Prediabetes    History of same pending A1c today      Relevant Orders   Hemoglobin A1c   Preventative health care - Primary    Discussed age-appropriate immunizations and screening exams.  Did review patient's personal, surgical, social, family history.  Patient up-to-date on all age-appropriate vaccinations she would like.  She declined the flu vaccine and the shingles vaccine today.  Patient up-to-date on CRC screening.  Patient is aged out of cervical cancer screening.  Patient is up-to-date on breast cancer screening.  Patient was given information at discharge about preventative healthcare maintenance with anticipatory guidance.      Relevant Orders   CBC   Comprehensive metabolic panel   TSH   Other Visit Diagnoses     Screening for osteoporosis       Relevant Orders   DG Bone Density       Return in about 3 months (around 10/04/2023) for BP recheck.    Audria Nine, NP

## 2023-07-04 NOTE — Assessment & Plan Note (Signed)
Status postmastectomy.  Mammogram up-to-date

## 2023-07-04 NOTE — Assessment & Plan Note (Signed)
Patient currently maintained on amlodipine 5 mg daily.  She did take time off the medication and has been back on it for about 1 week.  Will not change medication today she will check her blood pressure at home parameters given at discharge

## 2023-07-04 NOTE — Assessment & Plan Note (Signed)
History of the same pending lipid panel

## 2023-07-04 NOTE — Assessment & Plan Note (Signed)
History of same pending A1c today

## 2023-07-04 NOTE — Assessment & Plan Note (Signed)
Infrequent albuterol use.  Continue albuterol inhaler as needed

## 2023-07-04 NOTE — Patient Instructions (Signed)
Nice to see you today I want to see you in 3 months Check blood pressure at home want the top number below 140 and the bottom number below 90 consistently.

## 2023-07-05 LAB — CBC
HCT: 40.6 % (ref 36.0–46.0)
Hemoglobin: 13 g/dL (ref 12.0–15.0)
MCHC: 32.1 g/dL (ref 30.0–36.0)
MCV: 93.6 fL (ref 78.0–100.0)
Platelets: 325 10*3/uL (ref 150.0–400.0)
RBC: 4.34 Mil/uL (ref 3.87–5.11)
RDW: 13.9 % (ref 11.5–15.5)
WBC: 11.5 10*3/uL — ABNORMAL HIGH (ref 4.0–10.5)

## 2023-07-05 LAB — COMPREHENSIVE METABOLIC PANEL
ALT: 20 U/L (ref 0–35)
AST: 18 U/L (ref 0–37)
Albumin: 4.6 g/dL (ref 3.5–5.2)
Alkaline Phosphatase: 83 U/L (ref 39–117)
BUN: 18 mg/dL (ref 6–23)
CO2: 26 meq/L (ref 19–32)
Calcium: 9.5 mg/dL (ref 8.4–10.5)
Chloride: 104 meq/L (ref 96–112)
Creatinine, Ser: 0.67 mg/dL (ref 0.40–1.20)
GFR: 90.65 mL/min (ref >60.00)
Glucose, Bld: 88 mg/dL (ref 70–99)
Potassium: 3.9 meq/L (ref 3.5–5.1)
Sodium: 140 meq/L (ref 135–145)
Total Bilirubin: 0.3 mg/dL (ref 0.2–1.2)
Total Protein: 7 g/dL (ref 6.0–8.3)

## 2023-07-05 LAB — LIPID PANEL
Cholesterol: 236 mg/dL — ABNORMAL HIGH (ref 0–200)
HDL: 55.8 mg/dL (ref >39.00)
LDL Cholesterol: 147 mg/dL — ABNORMAL HIGH (ref 0–99)
NonHDL: 180.09
Total CHOL/HDL Ratio: 4
Triglycerides: 167 mg/dL — ABNORMAL HIGH (ref 0.0–149.0)
VLDL: 33.4 mg/dL (ref 0.0–40.0)

## 2023-07-05 LAB — TSH: TSH: 1.03 u[IU]/mL (ref 0.35–5.50)

## 2023-07-05 LAB — HEMOGLOBIN A1C: Hgb A1c MFr Bld: 5.9 % (ref 4.6–6.5)

## 2023-07-10 ENCOUNTER — Encounter: Payer: Self-pay | Admitting: Nurse Practitioner

## 2023-07-10 DIAGNOSIS — E782 Mixed hyperlipidemia: Secondary | ICD-10-CM

## 2023-07-11 MED ORDER — ATORVASTATIN CALCIUM 10 MG PO TABS
10.0000 mg | ORAL_TABLET | Freq: Every day | ORAL | 0 refills | Status: DC
Start: 2023-07-11 — End: 2023-10-08

## 2023-10-04 ENCOUNTER — Ambulatory Visit (INDEPENDENT_AMBULATORY_CARE_PROVIDER_SITE_OTHER)
Admission: RE | Admit: 2023-10-04 | Discharge: 2023-10-04 | Disposition: A | Discharge status: Home / Self Care | Payer: No Typology Code available for payment source | Source: Ambulatory Visit | Attending: Nurse Practitioner | Admitting: Nurse Practitioner

## 2023-10-04 ENCOUNTER — Ambulatory Visit (INDEPENDENT_AMBULATORY_CARE_PROVIDER_SITE_OTHER): Payer: No Typology Code available for payment source | Admitting: Nurse Practitioner

## 2023-10-04 ENCOUNTER — Other Ambulatory Visit: Payer: Self-pay | Admitting: Nurse Practitioner

## 2023-10-04 VITALS — BP 138/74 | HR 86 | Temp 98.3°F | Ht 60.0 in | Wt 149.6 lb

## 2023-10-04 DIAGNOSIS — I10 Essential (primary) hypertension: Secondary | ICD-10-CM

## 2023-10-04 DIAGNOSIS — M25561 Pain in right knee: Secondary | ICD-10-CM | POA: Diagnosis not present

## 2023-10-04 NOTE — Assessment & Plan Note (Signed)
 Exam benign in office. Likely a tendonitis. Will obtain xray today, pending results. Rest, ice, antiinflammatories inclusive of Voltaren gel. Knee sleeve when she is up on her feet a lot. If no improvement follow up with sports medicine in office

## 2023-10-04 NOTE — Assessment & Plan Note (Signed)
 Patient is currently on amlodipine 5mg . Tolerating medication well. BP controlled in office today. Continue medication as prescribed

## 2023-10-04 NOTE — Patient Instructions (Signed)
 Nice to see you today Make a fasting lab appointment over the next 2 weeks Follow up with Dr Patsy Lager if the knee does not improve Follow up with me in

## 2023-10-04 NOTE — Progress Notes (Signed)
 Established Patient Office Visit  Subjective   Patient ID: Allison Jensen, female    DOB: [birth year]  Age: 68 y.o. MRN: [id number]  Chief Complaint  Patient presents with   Follow-up    BP recheck. Last BP reading was on 12/9. 137/73     HPI  HTN: Patient was seen by me on 07/21/2023.  Patient was medicated with amlodipine  5 mg at that juncture but had taken a holiday and had been back on the medication approxi-1 week but blood pressure was above goal.  We continued amlodipine  5 mg outpatient follow-up.  She is here for follow-up today. States that she did check it at home and checkin git every frew days last itme was a month ago. No side effects.   Right knee pain: states that before thanksgiving she started having trouble. She is using magnesium, magnesium cream, brace, heat and ice. No injry that she knows about. States that she was babysiting her grandchild. States that she feels like her knee cathces and goes backwatds. State that it does not hurt with rest. If she does a lot she will have pain that is constant. States that It is going from the knee down. Sharp stabbing pain     Review of Systems  Constitutional:  Negative for chills and fever.  Respiratory:  Negative for shortness of breath.   Cardiovascular:  Negative for chest pain.  Musculoskeletal:  Positive for joint pain.  Neurological:  Negative for dizziness, tingling, weakness and headaches.  Psychiatric/Behavioral:  Negative for hallucinations and suicidal ideas.       Objective:     BP 138/74   Pulse 86   Temp 98.3 F (36.8 C) (Oral)   Ht 5' (1.524 m)   Wt 149 lb 9.6 oz (67.9 kg)   SpO2 97%   BMI 29.22 kg/m  BP Readings from Last 3 Encounters:  10/04/23 138/74  07/04/23 (!) 142/90  04/18/23 126/72   Wt Readings from Last 3 Encounters:  10/04/23 149 lb 9.6 oz (67.9 kg)  07/04/23 141 lb 9.6 oz (64.2 kg)  04/18/23 143 lb 12.8 oz (65.2 kg)      Physical Exam Vitals and nursing note reviewed.   Constitutional:      Appearance: Normal appearance.  Cardiovascular:     Rate and Rhythm: Normal rate and regular rhythm.     Pulses:          Posterior tibial pulses are 2+ on the right side and 2+ on the left side.     Heart sounds: Normal heart sounds.  Pulmonary:     Effort: Pulmonary effort is normal.     Breath sounds: Normal breath sounds.  Musculoskeletal:     Right knee: No bony tenderness. Tenderness present over the patellar tendon. Normal meniscus.     Right lower leg: No edema.     Left lower leg: No edema.  Neurological:     Mental Status: She is alert.      No results found for any visits on 10/04/23.    The 10-year ASCVD risk score (Arnett DK, et al., 2019) is: 11.3%    Assessment & Plan:   Problem List Items Addressed This Visit       Cardiovascular and Mediastinum   Primary hypertension   Patient is currently on amlodipine  5mg . Tolerating medication well. BP controlled in office today. Continue medication as prescribed         Other   Acute pain of right knee - Primary  Exam benign in office. Likely a tendonitis. Will obtain xray today, pending results. Rest, ice, antiinflammatories inclusive of Voltaren gel. Knee sleeve when she is up on her feet a lot. If no improvement follow up with sports medicine in office       Relevant Orders   DG Knee Complete 4 Views Right    Return in about 9 months (around 07/03/2024) for CPE and Labs.    Adina Crandall, NP

## 2023-10-07 ENCOUNTER — Other Ambulatory Visit: Payer: Self-pay | Admitting: Nurse Practitioner

## 2023-10-07 DIAGNOSIS — E782 Mixed hyperlipidemia: Secondary | ICD-10-CM

## 2023-10-08 DIAGNOSIS — E663 Overweight: Secondary | ICD-10-CM | POA: Diagnosis not present

## 2023-10-08 DIAGNOSIS — E785 Hyperlipidemia, unspecified: Secondary | ICD-10-CM | POA: Diagnosis not present

## 2023-10-08 DIAGNOSIS — J45909 Unspecified asthma, uncomplicated: Secondary | ICD-10-CM | POA: Diagnosis not present

## 2023-10-08 DIAGNOSIS — I10 Essential (primary) hypertension: Secondary | ICD-10-CM | POA: Diagnosis not present

## 2023-10-08 DIAGNOSIS — Z008 Encounter for other general examination: Secondary | ICD-10-CM | POA: Diagnosis not present

## 2023-10-08 DIAGNOSIS — Z853 Personal history of malignant neoplasm of breast: Secondary | ICD-10-CM | POA: Diagnosis not present

## 2023-10-08 DIAGNOSIS — R7303 Prediabetes: Secondary | ICD-10-CM | POA: Diagnosis not present

## 2023-10-08 DIAGNOSIS — Z6828 Body mass index (BMI) 28.0-28.9, adult: Secondary | ICD-10-CM | POA: Diagnosis not present

## 2023-10-11 ENCOUNTER — Other Ambulatory Visit (INDEPENDENT_AMBULATORY_CARE_PROVIDER_SITE_OTHER): Payer: No Typology Code available for payment source

## 2023-10-11 DIAGNOSIS — E782 Mixed hyperlipidemia: Secondary | ICD-10-CM | POA: Diagnosis not present

## 2023-10-11 LAB — LIPID PANEL
Cholesterol: 168 mg/dL (ref 0–200)
HDL: 58 mg/dL (ref >39.00)
LDL Cholesterol: 93 mg/dL (ref 0–99)
NonHDL: 109.7
Total CHOL/HDL Ratio: 3
Triglycerides: 82 mg/dL (ref 0.0–149.0)
VLDL: 16.4 mg/dL (ref 0.0–40.0)

## 2023-10-11 LAB — HEPATIC FUNCTION PANEL
ALT: 26 U/L (ref 0–35)
AST: 20 U/L (ref 0–37)
Albumin: 4.8 g/dL (ref 3.5–5.2)
Alkaline Phosphatase: 91 U/L (ref 39–117)
Bilirubin, Direct: 0.1 mg/dL (ref 0.0–0.3)
Total Bilirubin: 0.5 mg/dL (ref 0.2–1.2)
Total Protein: 7.7 g/dL (ref 6.0–8.3)

## 2023-10-16 ENCOUNTER — Other Ambulatory Visit: Payer: Self-pay | Admitting: Nurse Practitioner

## 2023-10-16 DIAGNOSIS — E782 Mixed hyperlipidemia: Secondary | ICD-10-CM

## 2023-10-16 MED ORDER — ATORVASTATIN CALCIUM 10 MG PO TABS: 10.0000 mg | ORAL_TABLET | Freq: Every day | ORAL | 2 refills | Status: DC

## 2023-10-30 ENCOUNTER — Other Ambulatory Visit: Payer: Self-pay | Admitting: Nurse Practitioner

## 2023-10-30 DIAGNOSIS — I10 Essential (primary) hypertension: Secondary | ICD-10-CM

## 2024-01-16 ENCOUNTER — Other Ambulatory Visit: Payer: Self-pay | Admitting: Internal Medicine

## 2024-01-16 ENCOUNTER — Encounter: Payer: Self-pay | Admitting: Internal Medicine

## 2024-01-16 DIAGNOSIS — Z1231 Encounter for screening mammogram for malignant neoplasm of breast: Secondary | ICD-10-CM

## 2024-02-26 ENCOUNTER — Ambulatory Visit
Admission: RE | Admit: 2024-02-26 | Discharge: 2024-02-26 | Disposition: A | Discharge status: Home / Self Care | Source: Ambulatory Visit | Attending: Internal Medicine | Admitting: Internal Medicine

## 2024-02-26 ENCOUNTER — Ambulatory Visit
Admission: RE | Admit: 2024-02-26 | Discharge: 2024-02-26 | Disposition: A | Discharge status: Home / Self Care | Source: Ambulatory Visit | Attending: Nurse Practitioner | Admitting: Nurse Practitioner

## 2024-02-26 DIAGNOSIS — Z1231 Encounter for screening mammogram for malignant neoplasm of breast: Secondary | ICD-10-CM | POA: Insufficient documentation

## 2024-02-26 DIAGNOSIS — M8589 Other specified disorders of bone density and structure, multiple sites: Secondary | ICD-10-CM | POA: Diagnosis not present

## 2024-02-26 DIAGNOSIS — Z13820 Encounter for screening for osteoporosis: Secondary | ICD-10-CM | POA: Insufficient documentation

## 2024-02-26 DIAGNOSIS — Z780 Asymptomatic menopausal state: Secondary | ICD-10-CM | POA: Insufficient documentation

## 2024-02-29 ENCOUNTER — Ambulatory Visit: Payer: Self-pay | Admitting: Internal Medicine

## 2024-03-03 ENCOUNTER — Ambulatory Visit: Payer: Self-pay | Admitting: Nurse Practitioner

## 2024-05-04 ENCOUNTER — Other Ambulatory Visit: Payer: Self-pay | Admitting: Nurse Practitioner

## 2024-05-04 DIAGNOSIS — I10 Essential (primary) hypertension: Secondary | ICD-10-CM

## 2024-05-28 ENCOUNTER — Encounter: Payer: Self-pay | Admitting: Nurse Practitioner

## 2024-06-02 ENCOUNTER — Ambulatory Visit (INDEPENDENT_AMBULATORY_CARE_PROVIDER_SITE_OTHER)

## 2024-06-02 VITALS — BP 134/81 | Ht 60.0 in | Wt 140.0 lb

## 2024-06-02 DIAGNOSIS — Z0000 Encounter for general adult medical examination without abnormal findings: Secondary | ICD-10-CM | POA: Diagnosis not present

## 2024-06-02 NOTE — Progress Notes (Signed)
 Because this visit was a virtual/telehealth visit,  certain criteria was not obtained, such a blood pressure, CBG if applicable, and timed get up and go. Any medications not marked as taking were not mentioned during the medication reconciliation part of the visit. Any vitals not documented were not able to be obtained due to this being a telehealth visit or patient was unable to self-report a recent blood pressure reading due to a lack of equipment at home via telehealth. Vitals that have been documented are verbally provided by the patient.   This visit was performed by a medical professional under my direct supervision. I was immediately available for consultation/collaboration. I have reviewed and agree with the Annual Wellness Visit documentation.  Subjective:   Allison Jensen is a 68 y.o. who presents for a Medicare Wellness preventive visit.  As a reminder, Annual Wellness Visits don't include a physical exam, and some assessments may be limited, especially if this visit is performed virtually. We may recommend an in-person follow-up visit with your provider if needed.  Visit Complete: Virtual I connected with  Allison Jensen on 06/02/24 by a audio enabled telemedicine application and verified that I am speaking with the correct person using two identifiers.  Patient Location: Home  Provider Location: Home Office  I discussed the limitations of evaluation and management by telemedicine. The patient expressed understanding and agreed to proceed.  Vital Signs: Because this visit was a virtual/telehealth visit, some criteria may be missing or patient reported. Any vitals not documented were not able to be obtained and vitals that have been documented are patient reported.  VideoDeclined- This patient declined Librarian, academic. Therefore the visit was completed with audio only.  Persons Participating in Visit: Patient.  AWV Questionnaire: No: Patient  Medicare AWV questionnaire was not completed prior to this visit.  Cardiac Risk Factors include: advanced age (>75men, >35 women);hypertension     Objective:    Today's Vitals   06/02/24 1308  BP: 134/81  Weight: 140 lb (63.5 kg)  Height: 5' (1.524 m)   Body mass index is 27.34 kg/m.     06/02/2024    1:07 PM 06/29/2022   11:33 AM  Advanced Directives  Does Patient Have a Medical Advance Directive? Yes No  Type of Estate agent of [place];Living will   Does patient want to make changes to medical advance directive? No - Patient declined   Copy of Healthcare Power of Attorney in Chart? No - copy requested   Would patient like information on creating a medical advance directive?  Yes (MAU/Ambulatory/Procedural Areas - Information given)    Current Medications (verified) Outpatient Encounter Medications as of 06/02/2024  Medication Sig   albuterol  (VENTOLIN  HFA) 108 (90 Base) MCG/ACT inhaler Inhale 2 puffs into the lungs every 6 (six) hours as needed for wheezing or shortness of breath.   amLODipine  (NORVASC ) 5 MG tablet TAKE 1 TABLET (5 MG TOTAL) BY MOUTH DAILY.   atorvastatin  (LIPITOR) 10 MG tablet Take 1 tablet (10 mg total) by mouth daily.   diphenhydrAMINE (BENADRYL) 25 mg capsule Take 25 mg by mouth at bedtime as needed for sleep.   Melatonin 5 MG CAPS Take 1 capsule by mouth at bedtime.   cyanocobalamin 1000 MCG tablet Take 1,000 mcg by mouth daily. (Patient not taking: Reported on 10/04/2023)   No facility-administered encounter medications on file as of 06/02/2024.    Allergies (verified) Patient has no known allergies.   History: Past Medical History:  Diagnosis  Date   Allergy    Arthritis    Asthma    Breast cancer (HCC)    History of chicken pox    History of chronic bronchitis    Hyperthyroidism    Past Surgical History:  Procedure Laterality Date   CATARACT EXTRACTION, BILATERAL  03/2019   CESAREAN SECTION     MASTECTOMY Left 2004    transflap   Family History  Problem Relation Age of Onset   Arthritis Mother    Hypertension Mother    GER disease Mother    COPD Father    Hypertension Father    Clotting disorder Father        heparin induced clotting   COPD Sister    Ovarian cancer Sister 64       unknown genetic testing   Alcohol abuse Maternal Grandmother    Arthritis Maternal Grandmother    Diabetes Paternal Grandmother    Hypertension Paternal Grandfather    Breast cancer Neg Hx    Social History   Socioeconomic History   Marital status: Married    Spouse name: [name]   Number of children: 3   Years of education: associates degree in nursing   Highest education level: Associate degree: academic program  Occupational History   Occupation: Paediatric nurse    Comment: [employer] in [place]  Tobacco Use   Smoking status: Never    Passive exposure: Current   Smokeless tobacco: Never  Vaping Use   Vaping status: Never Used  Substance and Sexual Activity   Alcohol use: Yes    Comment: once a month, 1 glass   Drug use: Never   Sexual activity: Yes    Birth control/protection: Post-menopausal  Other Topics Concern   Not on file  Social History Narrative   09/07/19   From: moved from [place]  - moved to be near family   Living: with husband Allison Jensen   Work: 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 Volunteer for [employer]-- and will now be paid      Family: 3 children- Allison Jensen, and Allison Jensen -- has 3 grandchildren (3 local 79 years old and 20 yo, and 68 yo in [place])      Enjoys: painting      Exercise: walking with daughter Allison three days a week   Diet: good overall, better than it was - has been Keto with some cheat days      Safety   Seat belts: Yes    Guns: Yes  and secure   Safe in relationships: Yes    Social Drivers of Health   Financial Resource Strain: Low Risk  (06/02/2024)   Overall Financial Resource Strain (CARDIA)     Difficulty of Paying Living Expenses: Not hard at all  Food Insecurity: No Food Insecurity (06/02/2024)   Hunger Vital Sign    Worried About Running Out of Food in the Last Year: Never true    Ran Out of Food in the Last Year: Never true  Transportation Needs: No Transportation Needs (06/02/2024)   PRAPARE - Administrator, Civil Service (Medical): No    Lack of Transportation (Non-Medical): No  Physical Activity: Sufficiently Active (06/02/2024)   Exercise Vital Sign    Days of Exercise per Week: 6 days    Minutes of Exercise per Session: 40 min  Stress: No Stress Concern Present (06/02/2024)   [employer] of Occupational Health - Occupational Stress Questionnaire  Feeling of Stress: Only a little  Social Connections: Socially Integrated (06/02/2024)   Social Connection and Isolation Panel    Frequency of Communication with Friends and Family: More than three times a week    Frequency of Social Gatherings with Friends and Family: Three times a week    Attends Religious Services: More than 4 times per year    Active Member of Clubs or Organizations: Yes    Attends Engineer, structural: More than 4 times per year    Marital Status: Married    Tobacco Counseling Counseling given: Not Answered    Clinical Intake:  Pre-visit preparation completed: Yes  Pain : No/denies pain     BMI - recorded: 27.34 Nutritional Status: BMI 25 -29 Overweight Nutritional Risks: None Diabetes: No  Lab Results  Component Value Date   HGBA1C 5.9 07/04/2023   HGBA1C 6.1 06/29/2022   HGBA1C 5.8 05/31/2020     How often do you need to have someone help you when you read instructions, pamphlets, or other written materials from your doctor or pharmacy?: 1 - Never  Interpreter Needed?: No  Information entered by :: Matelyn Antonelli,CMA   Activities of Daily Living     06/02/2024   12:07 PM  In your present state of health, do you have any difficulty performing the  following activities:  Hearing? 0  Vision? 1  Difficulty concentrating or making decisions? 0  Walking or climbing stairs? 0  Dressing or bathing? 0  Doing errands, shopping? 0  Preparing Food and eating ? N  Using the Toilet? N  In the past six months, have you accidently leaked urine? N  Do you have problems with loss of bowel control? Y  Managing your Medications? N  Managing your Finances? N  Housekeeping or managing your Housekeeping? N    Patient Care Team: Allison Lynwood HERO, NP as PCP - General (Nurse Practitioner)  I have updated your Care Teams any recent Medical Services you may have received from other providers in the past year.     Assessment:   This is a routine wellness examination for Allison Jensen.  Hearing/Vision screen Hearing Screening - Comments:: Patient has no difficulties  Vision Screening - Comments:: Wears glasses.   Goals Addressed             This Visit's Progress    Patient Stated   On track    Join silver sneakers       Depression Screen     06/02/2024    1:11 PM 10/04/2023    2:02 PM 07/04/2023    2:23 PM 04/18/2023    1:55 PM 03/18/2023    1:44 PM 06/29/2022   11:52 AM 05/31/2020   11:42 AM  PHQ 2/9 Scores  PHQ - 2 Score 0 0 0 0 0 0 0  PHQ- 9 Score 0 0 0 0 3      Fall Risk     06/02/2024   12:07 PM 10/04/2023    2:02 PM 07/04/2023    2:24 PM 04/18/2023    1:56 PM 03/18/2023    1:44 PM  Fall Risk   Falls in the past year? 0 0 0 0 0  Number falls in past yr: 0 0 0 0 0  Injury with Fall? 0 0 0 0 0  Risk for fall due to : No Fall Risks No Fall Risks No Fall Risks No Fall Risks No Fall Risks  Follow up Falls evaluation completed Falls evaluation completed  Falls evaluation completed Falls evaluation completed Falls evaluation completed    MEDICARE RISK AT HOME:  Medicare Risk at Home Any stairs in or around the home?: No If so, are there any without handrails?: (Patient-Rptd) Yes Home free of loose throw rugs in walkways, pet beds,  electrical cords, etc?: (Patient-Rptd) Yes Adequate lighting in your home to reduce risk of falls?: (Patient-Rptd) Yes Life alert?: (Patient-Rptd) No Use of a cane, walker or w/c?: (Patient-Rptd) No Grab bars in the bathroom?: (Patient-Rptd) No Shower chair or bench in shower?: (Patient-Rptd) No Elevated toilet seat or a handicapped toilet?: (Patient-Rptd) No  TIMED UP AND GO:  Was the test performed?  No  Cognitive Function: 6CIT completed        06/02/2024    1:11 PM  6CIT Screen  What Year? 0 points  What month? 0 points  What time? 0 points  Count back from 20 0 points  Months in reverse 0 points  Repeat phrase 0 points  Total Score 0 points    Immunizations Immunization History  Administered Date(s) Administered   Pneumococcal Polysaccharide-23 01/01/2018    Screening Tests Health Maintenance  Topic Date Due   DTaP/Tdap/Td (1 - Tdap) Never done   Zoster Vaccines- Shingrix (1 of 2) Never done   Pneumococcal Vaccine: 50+ Years (2 of 2 - PCV) 01/02/2019   INFLUENZA VACCINE  Never done   Medicare Annual Wellness (AWV)  06/02/2025   MAMMOGRAM  02/25/2026   Colonoscopy  05/31/2029   DEXA SCAN  Completed   Hepatitis C Screening  Completed   HPV VACCINES  Aged Out   Meningococcal B Vaccine  Aged Out   COVID-19 Vaccine  Discontinued    Health Maintenance  Health Maintenance Due  Topic Date Due   DTaP/Tdap/Td (1 - Tdap) Never done   Zoster Vaccines- Shingrix (1 of 2) Never done   Pneumococcal Vaccine: 50+ Years (2 of 2 - PCV) 01/02/2019   INFLUENZA VACCINE  Never done   Health Maintenance Items Addressed:patient declined  Additional Screening:  Vision Screening: Recommended annual ophthalmology exams for early detection of glaucoma and other disorders of the eye. Would you like a referral to an eye doctor? No    Dental Screening: Recommended annual dental exams for proper oral hygiene  Community Resource Referral / Chronic Care Management: CRR required  this visit?  No   CCM required this visit?  No   Plan:    I have personally reviewed and noted the following in the patient's chart:   Medical and social history Use of alcohol, tobacco or illicit drugs  Current medications and supplements including opioid prescriptions. Patient is not currently taking opioid prescriptions. Functional ability and status Nutritional status Physical activity Advanced directives List of other physicians Hospitalizations, surgeries, and ER visits in previous 12 months Vitals Screenings to include cognitive, depression, and falls Referrals and appointments  In addition, I have reviewed and discussed with patient certain preventive protocols, quality metrics, and best practice recommendations. A written personalized care plan for preventive services as well as general preventive health recommendations were provided to patient.   Lyle MARLA Right, [place]   06/02/2024   After Visit Summary: (MyChart) Due to this being a telephonic visit, the after visit summary with patients personalized plan was offered to patient via MyChart   Notes: Nothing significant to report at this time.

## 2024-06-02 NOTE — Patient Instructions (Signed)
 Allison Jensen , Thank you for taking time out of your busy schedule to complete your Annual Wellness Visit with me. I enjoyed our conversation and look forward to speaking with you again next year. I, as well as your care team,  appreciate your ongoing commitment to your health goals. Please review the following plan we discussed and let me know if I can assist you in the future. Your Game plan/ To Do List    Referrals: If you haven't heard from the office you've been referred to, please reach out to them at the phone provided.   Follow up Visits: We will see or speak with you next year for your Next Medicare AWV with our clinical staff Have you seen your provider in the last 6 months (3 months if uncontrolled diabetes)? No  Clinician Recommendations:  Aim for 30 minutes of exercise or brisk walking, 6-8 glasses of water, and 5 servings of fruits and vegetables each day.       This is a list of the screenings recommended for you:  Health Maintenance  Topic Date Due   DTaP/Tdap/Td vaccine (1 - Tdap) Never done   Zoster (Shingles) Vaccine (1 of 2) Never done   Pneumococcal Vaccine for age over 36 (2 of 2 - PCV) 01/02/2019   Flu Shot  Never done   Medicare Annual Wellness Visit  06/02/2025   Mammogram  02/25/2026   Colon Cancer Screening  05/31/2029   DEXA scan (bone density measurement)  Completed   Hepatitis C Screening  Completed   HPV Vaccine  Aged Out   Meningitis B Vaccine  Aged Out   COVID-19 Vaccine  Discontinued    Advanced directives: (Declined) Advance directive discussed with you today. Even though you declined this today, please call our office should you change your mind, and we can give you the proper paperwork for you to fill out. Advance Care Planning is important because it:  [x]  Makes sure you receive the medical care that is consistent with your values, goals, and preferences  [x]  It provides guidance to your family and loved ones and reduces their decisional burden  about whether or not they are making the right decisions based on your wishes.  Follow the link provided in your after visit summary or read over the paperwork we have mailed to you to help you started getting your Advance Directives in place. If you need assistance in completing these, please reach out to us  so that we can help you!  See attachments for Preventive Care and Fall Prevention Tips.

## 2024-07-10 ENCOUNTER — Encounter: Payer: No Typology Code available for payment source | Admitting: Nurse Practitioner

## 2024-07-24 ENCOUNTER — Encounter: Payer: Self-pay | Admitting: Nurse Practitioner

## 2024-07-24 ENCOUNTER — Ambulatory Visit (INDEPENDENT_AMBULATORY_CARE_PROVIDER_SITE_OTHER): Admitting: Nurse Practitioner

## 2024-07-24 VITALS — BP 146/78 | HR 80 | Temp 97.8°F | Ht 59.25 in | Wt 149.0 lb

## 2024-07-24 DIAGNOSIS — R202 Paresthesia of skin: Secondary | ICD-10-CM

## 2024-07-24 DIAGNOSIS — E782 Mixed hyperlipidemia: Secondary | ICD-10-CM

## 2024-07-24 DIAGNOSIS — Z0000 Encounter for general adult medical examination without abnormal findings: Secondary | ICD-10-CM

## 2024-07-24 DIAGNOSIS — I10 Essential (primary) hypertension: Secondary | ICD-10-CM

## 2024-07-24 DIAGNOSIS — J4520 Mild intermittent asthma, uncomplicated: Secondary | ICD-10-CM

## 2024-07-24 DIAGNOSIS — R7303 Prediabetes: Secondary | ICD-10-CM

## 2024-07-24 DIAGNOSIS — M8580 Other specified disorders of bone density and structure, unspecified site: Secondary | ICD-10-CM

## 2024-07-24 DIAGNOSIS — Z853 Personal history of malignant neoplasm of breast: Secondary | ICD-10-CM

## 2024-07-24 NOTE — Assessment & Plan Note (Signed)
 Bilateral lower extremities distally.  PMS intact.  Check TSH, CBC, CMP, B12.  If all negative consider neurology referral

## 2024-07-24 NOTE — Patient Instructions (Signed)
 Nice to see you today  I will be in touch with the labs once I have them  Follow up with me in 1 year, sooner If you need me  Check blood pressure at home and I want the top to be under 140 and the bottom to be under 90 most of the time  If the labs look good then we will go to the neurologist

## 2024-07-24 NOTE — Assessment & Plan Note (Signed)
 Patient currently maintained on amlodipine  5 mg daily.  Blood pressures at home were within normal limits.  Patient is unsure if she took her antihypertensive today blood pressure elevated upon initial and recheck.  Continue checking blood pressure at home parameters given continue amlodipine  5 mg daily.

## 2024-07-24 NOTE — Assessment & Plan Note (Signed)
 Discussed age-appropriate immunizations and screening exams.  Did review patient's personal, surgical, social, family histories.  Patient is up-to-date on all age-appropriate vaccinations she would like.  She declined flu and shingles vaccine today.  Patient up-to-date on CRC screening, breast cancer screening, cervical cancer screening.  Patient's bone density scan is also up-to-date.  Patient was given information at discharge about preventative healthcare maintenance with anticipatory guidance.

## 2024-07-24 NOTE — Assessment & Plan Note (Signed)
 History of the same patient is working out regularly.  Pending A1c today

## 2024-07-24 NOTE — Assessment & Plan Note (Signed)
 History of the same.  Patient will use albuterol  inhaler as needed.  Stable at this juncture continue albuterol  inhaler as needed

## 2024-07-24 NOTE — Assessment & Plan Note (Signed)
 History of the same patient discontinued atorvastatin  due to leg pains which did not resolve with discontinuation.  Patient would like to see what lipid panel lives recommend patient going back on atorvastatin  but pending lipid panel first

## 2024-07-24 NOTE — Assessment & Plan Note (Signed)
 Pending vitamin D level and calcium  level.  Patient is doing resistance training

## 2024-07-24 NOTE — Progress Notes (Signed)
 Established Patient Office Visit  Subjective   Patient ID: Allison Jensen, female    DOB: Mar 05, 1956  Age: 68 y.o. MRN: [id number]  Chief Complaint  Patient presents with   Annual Exam    HPI   Asthma: has not had to use he albuterol  in an extended   HTN: Patient currently maintained on amlodipine  5 mg daily. States that she will take it at shome some. States that it is good 134/81 in September   HLD: Patient currently maintained on atorvastatin  10 mg daily. Patient states that she was having leg pains and stopped the medications. This did not effect the leg pains  Paresthessia: states that It is bilateral lower extremties that will go to sleep with rest. States that it is most of the time. States that she does not have pain. It is most of the time  for complete physical and follow up of chronic conditions.  Immunizations: -Tetanus: Completed in within 10 years can get at pharmacy -Influenza: declined -Shingles declined -Pneumonia: Completed 2019  Diet: Fair diet. She is eating 3 meals a day. She does not snack often. Sometimes at 4. Water and tea that is unsweet  [facility]. It is a class called active adult and stretching   Eye exam: Completes annually. Glasses   Dental exam: Completes semi-annually    Colonoscopy: Completed in 06/01/2019, repeat 10 years.  Patient due 2030 Lung Cancer Screening: N/A  Pap smear: 05/31/2020, negative HPV and negative cells.  Patient is aged out past age 59  Mammogram: 02/26/2024, repeat 1 year  DEXA: 02/26/2024, osteopenia  Sleep: goes to bed aron 10 and up around 7. Sometimes feels rested. Does not snore      Review of Systems  Constitutional:  Negative for chills and fever.  Respiratory:  Negative for shortness of breath.   Cardiovascular:  Negative for chest pain and leg swelling.  Gastrointestinal:  Negative for abdominal pain, blood in stool, constipation, diarrhea, nausea and  vomiting.  Genitourinary:  Negative for dysuria and hematuria.  Neurological:  Positive for tingling. Negative for weakness and headaches.  Psychiatric/Behavioral:  Negative for hallucinations and suicidal ideas.       Objective:     BP (!) 146/78   Pulse 80   Temp 97.8 F (36.6 C) (Oral)   Ht 4' 11.25 (1.505 m)   Wt 149 lb (67.6 kg)   SpO2 99%   BMI 29.84 kg/m  BP Readings from Last 3 Encounters:  07/24/24 (!) 146/78  06/02/24 134/81  10/04/23 138/74   Wt Readings from Last 3 Encounters:  07/24/24 149 lb (67.6 kg)  06/02/24 140 lb (63.5 kg)  10/04/23 149 lb 9.6 oz (67.9 kg)   SpO2 Readings from Last 3 Encounters:  07/24/24 99%  10/04/23 97%  07/04/23 97%      Physical Exam Vitals and nursing note reviewed.  Constitutional:      Appearance: Normal appearance.  HENT:     Right Ear: Tympanic membrane, ear canal and external ear normal.     Left Ear: Tympanic membrane, ear canal and external ear normal.     Mouth/Throat:     Mouth: Mucous membranes are moist.     Pharynx: Oropharynx is clear.  Eyes:     Extraocular Movements: Extraocular movements intact.     Pupils: Pupils are equal, round, and reactive to light.  Cardiovascular:     Rate and Rhythm: Normal rate  and regular rhythm.     Pulses: Normal pulses.     Heart sounds: Normal heart sounds.  Pulmonary:     Effort: Pulmonary effort is normal.     Breath sounds: Normal breath sounds.  Abdominal:     General: Bowel sounds are normal. There is no distension.     Palpations: There is no mass.     Tenderness: There is no abdominal tenderness.     Hernia: No hernia is present.  Musculoskeletal:     Right lower leg: No edema.     Left lower leg: No edema.  Lymphadenopathy:     Cervical: No cervical adenopathy.  Skin:    General: Skin is warm.  Neurological:     General: No focal deficit present.     Mental Status: She is alert.     Deep Tendon Reflexes:     Reflex Scores:      Bicep reflexes are  2+ on the right side and 2+ on the left side.      Patellar reflexes are 2+ on the right side and 2+ on the left side.    Comments: Bilateral upper and lower extremity strength 5/5  Psychiatric:        Mood and Affect: Mood normal.        Behavior: Behavior normal.        Thought Content: Thought content normal.        Judgment: Judgment normal.      No results found for any visits on 07/24/24.    The 10-year ASCVD risk score (Arnett DK, et al., 2019) is: 12.1%    Assessment & Plan:   Problem List Items Addressed This Visit       Cardiovascular and Mediastinum   Primary hypertension   Patient currently maintained on amlodipine  5 mg daily.  Blood pressures at home were within normal limits.  Patient is unsure if she took her antihypertensive today blood pressure elevated upon initial and recheck.  Continue checking blood pressure at home parameters given continue amlodipine  5 mg daily.      Relevant Orders   CBC with Differential/Platelet   Comprehensive metabolic panel with GFR   Hemoglobin A1c   Lipid panel   TSH     Respiratory   Reactive airway disease   History of the same.  Patient will use albuterol  inhaler as needed.  Stable at this juncture continue albuterol  inhaler as needed        Musculoskeletal and Integument   Osteopenia   Pending vitamin D level and calcium  level.  Patient is doing resistance training      Relevant Orders   VITAMIN D 25 Hydroxy (Vit-D Deficiency, Fractures)     Other   Hx of breast cancer   History of the same with mastectomy.  Patient's mammogram up-to-date      Mixed hyperlipidemia   History of the same patient discontinued atorvastatin  due to leg pains which did not resolve with discontinuation.  Patient would like to see what lipid panel lives recommend patient going back on atorvastatin  but pending lipid panel first      Relevant Orders   Hemoglobin A1c   Lipid panel   Prediabetes   History of the same patient is  working out regularly.  Pending A1c today      Relevant Orders   Hemoglobin A1c   Lipid panel   Preventative health care - Primary   Discussed age-appropriate immunizations and screening exams.  Did review  patient's personal, surgical, social, family histories.  Patient is up-to-date on all age-appropriate vaccinations she would like.  She declined flu and shingles vaccine today.  Patient up-to-date on CRC screening, breast cancer screening, cervical cancer screening.  Patient's bone density scan is also up-to-date.  Patient was given information at discharge about preventative healthcare maintenance with anticipatory guidance.      Relevant Orders   CBC with Differential/Platelet   Comprehensive metabolic panel with GFR   TSH   Paresthesias   Bilateral lower extremities distally.  PMS intact.  Check TSH, CBC, CMP, B12.  If all negative consider neurology referral      Relevant Orders   CBC with Differential/Platelet   Comprehensive metabolic panel with GFR   TSH   Vitamin B12    Return in about 1 year (around 07/24/2025) for CPE and Labs.    Adina Crandall, NP

## 2024-07-24 NOTE — Assessment & Plan Note (Signed)
 History of the same with mastectomy.  Patient's mammogram up-to-date

## 2024-07-25 LAB — CBC WITH DIFFERENTIAL/PLATELET
Absolute Lymphocytes: 2764 {cells}/uL (ref 850–3900)
Absolute Monocytes: 667 {cells}/uL (ref 200–950)
Basophils Absolute: 85 {cells}/uL (ref 0–200)
Basophils Relative: 0.9 %
Eosinophils Absolute: 179 {cells}/uL (ref 15–500)
Eosinophils Relative: 1.9 %
HCT: 38.7 % (ref 35.0–45.0)
Hemoglobin: 12.5 g/dL (ref 11.7–15.5)
MCH: 29.2 pg (ref 27.0–33.0)
MCHC: 32.3 g/dL (ref 32.0–36.0)
MCV: 90.4 fL (ref 80.0–100.0)
MPV: 12.2 fL (ref 7.5–12.5)
Monocytes Relative: 7.1 %
Neutro Abs: 5706 {cells}/uL (ref 1500–7800)
Neutrophils Relative %: 60.7 %
Platelets: 314 Thousand/uL (ref 140–400)
RBC: 4.28 Million/uL (ref 3.80–5.10)
RDW: 12.9 % (ref 11.0–15.0)
Total Lymphocyte: 29.4 %
WBC: 9.4 Thousand/uL (ref 3.8–10.8)

## 2024-07-25 LAB — LIPID PANEL
Cholesterol: 197 mg/dL (ref <200)
HDL: 52 mg/dL (ref >=50)
LDL Cholesterol (Calc): 116 mg/dL — ABNORMAL HIGH
Non-HDL Cholesterol (Calc): 145 mg/dL — ABNORMAL HIGH (ref <130)
Total CHOL/HDL Ratio: 3.8 (calc) (ref <5.0)
Triglycerides: 174 mg/dL — ABNORMAL HIGH (ref <150)

## 2024-07-25 LAB — COMPREHENSIVE METABOLIC PANEL WITH GFR
AG Ratio: 2.1 (calc) (ref 1.0–2.5)
ALT: 16 U/L (ref 6–29)
AST: 16 U/L (ref 10–35)
Albumin: 4.6 g/dL (ref 3.6–5.1)
Alkaline phosphatase (APISO): 74 U/L (ref 37–153)
BUN: 19 mg/dL (ref 7–25)
CO2: 28 mmol/L (ref 20–32)
Calcium: 9.4 mg/dL (ref 8.6–10.4)
Chloride: 105 mmol/L (ref 98–110)
Creat: 0.71 mg/dL (ref 0.50–1.05)
Globulin: 2.2 g/dL (ref 1.9–3.7)
Glucose, Bld: 113 mg/dL — ABNORMAL HIGH (ref 65–99)
Potassium: 3.9 mmol/L (ref 3.5–5.3)
Sodium: 140 mmol/L (ref 135–146)
Total Bilirubin: 0.3 mg/dL (ref 0.2–1.2)
Total Protein: 6.8 g/dL (ref 6.1–8.1)
eGFR: 93 mL/min/1.73m2 (ref >=60)

## 2024-07-25 LAB — HEMOGLOBIN A1C
Hgb A1c MFr Bld: 5.8 % — ABNORMAL HIGH (ref <5.7)
Mean Plasma Glucose: 120 mg/dL
eAG (mmol/L): 6.6 mmol/L

## 2024-07-25 LAB — TSH: TSH: 1.86 m[IU]/L (ref 0.40–4.50)

## 2024-07-25 LAB — VITAMIN B12: Vitamin B-12: 465 pg/mL (ref 200–1100)

## 2024-07-25 LAB — VITAMIN D 25 HYDROXY (VIT D DEFICIENCY, FRACTURES): Vit D, 25-Hydroxy: 18 ng/mL — ABNORMAL LOW (ref 30–100)

## 2024-07-28 ENCOUNTER — Ambulatory Visit: Payer: Self-pay | Admitting: Nurse Practitioner

## 2024-07-28 DIAGNOSIS — R202 Paresthesia of skin: Secondary | ICD-10-CM

## 2024-07-28 DIAGNOSIS — R200 Anesthesia of skin: Secondary | ICD-10-CM

## 2024-07-28 DIAGNOSIS — E559 Vitamin D deficiency, unspecified: Secondary | ICD-10-CM

## 2024-07-28 MED ORDER — VITAMIN D (ERGOCALCIFEROL) 1.25 MG (50000 UNIT) PO CAPS: 50000.0000 [IU] | ORAL_CAPSULE | ORAL | 0 refills | Status: AC

## 2024-07-31 NOTE — Telephone Encounter (Signed)
-----   Message from [facility] T sent at 07/29/2024  4:53 PM EDT ----- Pt prefers [facility].   Last read by Marval Hockey at 8:05AM on 07/28/2024.  ----- Message ----- From: Wendee Lynwood HERO, NP Sent: 07/28/2024   7:23 AM EDT To: Wendee Gunnels  Notified via My Chart   See below ----- Message ----- From: Rebecka Hose Lab Results In Sent: 07/24/2024  11:38 PM EDT To: Lynwood HERO Wendee, NP

## 2024-07-31 NOTE — Telephone Encounter (Signed)
 Referral placed.

## 2024-08-18 ENCOUNTER — Encounter: Payer: Self-pay | Admitting: Nurse Practitioner

## 2024-09-16 ENCOUNTER — Telehealth: Payer: Self-pay | Admitting: *Deleted

## 2024-09-16 NOTE — Telephone Encounter (Signed)
 Copied from CRM #[id number]. Topic: General - Other >> Sep 15, 2024  4:13 PM Rosina BIRCH wrote: Reason for CRM: narissa from [facility] clinic called stating they are getting referrals for the patient but there is no demographics [phone]

## 2024-10-22 ENCOUNTER — Other Ambulatory Visit: Payer: Self-pay | Admitting: Nurse Practitioner

## 2024-10-22 DIAGNOSIS — E782 Mixed hyperlipidemia: Secondary | ICD-10-CM

## 2024-10-30 ENCOUNTER — Other Ambulatory Visit: Payer: Self-pay | Admitting: Nurse Practitioner

## 2024-10-30 DIAGNOSIS — I10 Essential (primary) hypertension: Secondary | ICD-10-CM

## 2024-11-20 ENCOUNTER — Encounter: Admitting: Nurse Practitioner

## 2025-07-27 ENCOUNTER — Encounter: Admitting: Nurse Practitioner
